# Patient Record
Sex: Female | Born: 1935
Health system: Southern US, Community
[De-identification: ages and names within clinical notes are randomized; demographics above are authoritative.]

## PROBLEM LIST (undated history)

## (undated) DIAGNOSIS — E785 Hyperlipidemia, unspecified: Secondary | ICD-10-CM

## (undated) DIAGNOSIS — I10 Essential (primary) hypertension: Secondary | ICD-10-CM

## (undated) DIAGNOSIS — E079 Disorder of thyroid, unspecified: Secondary | ICD-10-CM

---

## 2005-01-10 ENCOUNTER — Ambulatory Visit: Payer: Self-pay | Admitting: Internal Medicine

## 2006-02-28 ENCOUNTER — Ambulatory Visit: Payer: Self-pay | Admitting: Internal Medicine

## 2007-03-08 ENCOUNTER — Ambulatory Visit: Payer: Self-pay | Admitting: Internal Medicine

## 2008-12-08 ENCOUNTER — Ambulatory Visit: Payer: Self-pay | Admitting: Internal Medicine

## 2011-01-20 ENCOUNTER — Ambulatory Visit: Payer: Self-pay | Admitting: Internal Medicine

## 2011-07-15 ENCOUNTER — Ambulatory Visit: Payer: Self-pay | Admitting: Ophthalmology

## 2011-07-15 DIAGNOSIS — I10 Essential (primary) hypertension: Secondary | ICD-10-CM

## 2011-07-25 ENCOUNTER — Ambulatory Visit: Payer: Self-pay | Admitting: Ophthalmology

## 2012-12-19 ENCOUNTER — Ambulatory Visit: Payer: Self-pay | Admitting: Internal Medicine

## 2014-06-24 DIAGNOSIS — E538 Deficiency of other specified B group vitamins: Secondary | ICD-10-CM | POA: Diagnosis not present

## 2014-07-18 DIAGNOSIS — Z961 Presence of intraocular lens: Secondary | ICD-10-CM | POA: Diagnosis not present

## 2014-07-23 DIAGNOSIS — E538 Deficiency of other specified B group vitamins: Secondary | ICD-10-CM | POA: Diagnosis not present

## 2014-07-23 DIAGNOSIS — M170 Bilateral primary osteoarthritis of knee: Secondary | ICD-10-CM | POA: Diagnosis not present

## 2014-08-26 DIAGNOSIS — E538 Deficiency of other specified B group vitamins: Secondary | ICD-10-CM | POA: Diagnosis not present

## 2014-08-29 ENCOUNTER — Ambulatory Visit: Payer: Self-pay | Admitting: Podiatry

## 2014-09-25 DIAGNOSIS — E538 Deficiency of other specified B group vitamins: Secondary | ICD-10-CM | POA: Diagnosis not present

## 2014-10-03 ENCOUNTER — Emergency Department
Admit: 2014-10-03 | Disposition: A | Discharge status: Home / Self Care | Payer: Self-pay | Admitting: Emergency Medicine

## 2014-10-03 DIAGNOSIS — D1809 Hemangioma of other sites: Secondary | ICD-10-CM | POA: Diagnosis not present

## 2014-10-03 DIAGNOSIS — C499 Malignant neoplasm of connective and soft tissue, unspecified: Secondary | ICD-10-CM | POA: Diagnosis not present

## 2014-10-03 DIAGNOSIS — I10 Essential (primary) hypertension: Secondary | ICD-10-CM | POA: Diagnosis not present

## 2014-10-03 DIAGNOSIS — R109 Unspecified abdominal pain: Secondary | ICD-10-CM | POA: Diagnosis not present

## 2014-10-03 DIAGNOSIS — D1803 Hemangioma of intra-abdominal structures: Secondary | ICD-10-CM | POA: Diagnosis not present

## 2014-10-03 LAB — URINALYSIS, COMPLETE
Bilirubin,UR: NEGATIVE
Glucose,UR: NEGATIVE mg/dL (ref 0–75)
Ketone: NEGATIVE
Nitrite: NEGATIVE
Ph: 7 (ref 4.5–8.0)
Protein: NEGATIVE
Specific Gravity: 1.012 (ref 1.003–1.030)

## 2014-10-03 LAB — CBC WITH DIFFERENTIAL/PLATELET
Basophil #: 0 10*3/uL (ref 0.0–0.1)
Basophil %: 0.3 %
Eosinophil #: 0.1 10*3/uL (ref 0.0–0.7)
Eosinophil %: 0.4 %
HCT: 36.5 % (ref 35.0–47.0)
HGB: 12 g/dL (ref 12.0–16.0)
LYMPHS ABS: 0.5 10*3/uL — AB (ref 1.0–3.6)
Lymphocyte %: 4.1 %
MCH: 29.2 pg (ref 26.0–34.0)
MCHC: 32.9 g/dL (ref 32.0–36.0)
MCV: 89 fL (ref 80–100)
MONO ABS: 0.9 x10 3/mm (ref 0.2–0.9)
MONOS PCT: 7.7 %
NEUTROS ABS: 10.6 10*3/uL — AB (ref 1.4–6.5)
Neutrophil %: 87.5 %
Platelet: 280 10*3/uL (ref 150–440)
RBC: 4.11 10*6/uL (ref 3.80–5.20)
RDW: 13.2 % (ref 11.5–14.5)
WBC: 12.1 10*3/uL — ABNORMAL HIGH (ref 3.6–11.0)

## 2014-10-03 LAB — COMPREHENSIVE METABOLIC PANEL
ALBUMIN: 4 g/dL
ALK PHOS: 73 U/L
ALT: 15 U/L
ANION GAP: 9 (ref 7–16)
BUN: 15 mg/dL
Bilirubin,Total: 0.7 mg/dL
Calcium, Total: 8.9 mg/dL
Chloride: 102 mmol/L
Co2: 26 mmol/L
Creatinine: 0.81 mg/dL
EGFR (African American): >60
Glucose: 140 mg/dL — ABNORMAL HIGH
Potassium: 3.5 mmol/L
SGOT(AST): 29 U/L
Sodium: 137 mmol/L
TOTAL PROTEIN: 7.6 g/dL

## 2014-10-05 NOTE — Op Note (Signed)
PATIENT NAME:  Carolyn Fields, STAKES MR#:  [id number] DATE OF BIRTH:  1922/07/28  DATE OF PROCEDURE:  07/25/2011  PREOPERATIVE DIAGNOSIS:  Cataract, right eye.   POSTOPERATIVE DIAGNOSIS:  Cataract, right eye.  PROCEDURE PERFORMED:  Extracapsular cataract extraction using phacoemulsification with placement of an Alcon SN6CWS 23.5-diopter posterior chamber lens, serial # [id number].  SURGEON:  Loura Back. Avianna Moynahan, MD  ASSISTANT:  None.  ANESTHESIA:  4% lidocaine and 0.75% Marcaine in a 50/50 mixture with 10 units/ mL of Hylenex added, given as a peribulbar.  ANESTHESIOLOGIST:  Dr. Boston Service   COMPLICATIONS:  None.  ESTIMATED BLOOD LOSS:  Less than 1 mL.  DESCRIPTION OF PROCEDURE:  The patient was brought to the operating room and given a peribulbar block.  The patient was then prepped and draped in the usual fashion.  The vertical rectus muscles were imbricated using 5-0 silk sutures.  These sutures were then clamped to the sterile drapes as bridle sutures.  A limbal peritomy was performed extending two clock hours and hemostasis was obtained with cautery.  A partial thickness scleral groove was made at the surgical limbus and dissected anteriorly in a lamellar dissection using an Alcon crescent knife.  The anterior chamber was entered superonasally with a Superblade and through the lamellar dissection with a 2.6 mm keratome.  DisCoVisc was used to replace the aqueous and a continuous tear capsulorrhexis was carried out.  Hydrodissection and hydrodelineation were carried out with balanced salt and a 27 gauge canula.  The nucleus was rotated to confirm the effectiveness of the hydrodissection.  Phacoemulsification was carried out using a divide-and-conquer technique.  Total ultrasound time was 1 minute and 17 seconds with an average power of 19.3 percent.  Irrigation/aspiration was used to remove the residual cortex.  DisCoVisc was used to inflate the capsule and the internal incision was  enlarged to 3 mm with the crescent knife.  The intraocular lens was folded and inserted into the capsular bag using the AcrySert delivery system.  Irrigation/aspiration was used to remove the residual DisCoVisc.  Miostat was injected into the anterior chamber through the paracentesis track to inflate the anterior chamber and induce miosis.  The wound was checked for leaks and none were found. The conjunctiva was closed with cautery and the bridle sutures were removed.  Two drops of 0.3% Vigamox were placed on the eye.   An eye shield was placed on the eye.  The patient was discharged to the recovery room in good condition.  ____________________________ Loura Back Ladarius Seubert, MD sad:cms D: 07/25/2011 13:03:34 ET T: 07/25/2011 13:38:43 ET JOB#: [id number]  cc: Remo Lipps A. Courtany Mcmurphy, MD, <Dictator>  Martie Lee MD ELECTRONICALLY SIGNED 08/01/2011 13:31

## 2014-10-07 DIAGNOSIS — E538 Deficiency of other specified B group vitamins: Secondary | ICD-10-CM | POA: Diagnosis not present

## 2014-10-07 DIAGNOSIS — R1084 Generalized abdominal pain: Secondary | ICD-10-CM | POA: Diagnosis not present

## 2014-10-07 DIAGNOSIS — N8111 Cystocele, midline: Secondary | ICD-10-CM | POA: Diagnosis not present

## 2014-10-07 DIAGNOSIS — S50312A Abrasion of left elbow, initial encounter: Secondary | ICD-10-CM | POA: Diagnosis not present

## 2014-10-28 DIAGNOSIS — E538 Deficiency of other specified B group vitamins: Secondary | ICD-10-CM | POA: Diagnosis not present

## 2014-11-18 DIAGNOSIS — M170 Bilateral primary osteoarthritis of knee: Secondary | ICD-10-CM | POA: Diagnosis not present

## 2014-11-18 DIAGNOSIS — N8111 Cystocele, midline: Secondary | ICD-10-CM | POA: Diagnosis not present

## 2014-12-02 DIAGNOSIS — E538 Deficiency of other specified B group vitamins: Secondary | ICD-10-CM | POA: Diagnosis not present

## 2014-12-02 DIAGNOSIS — I10 Essential (primary) hypertension: Secondary | ICD-10-CM | POA: Diagnosis not present

## 2014-12-02 DIAGNOSIS — E039 Hypothyroidism, unspecified: Secondary | ICD-10-CM | POA: Diagnosis not present

## 2014-12-02 DIAGNOSIS — I272 Other secondary pulmonary hypertension: Secondary | ICD-10-CM | POA: Diagnosis not present

## 2015-01-01 DIAGNOSIS — E538 Deficiency of other specified B group vitamins: Secondary | ICD-10-CM | POA: Diagnosis not present

## 2015-01-06 DIAGNOSIS — Q6689 Other  specified congenital deformities of feet: Secondary | ICD-10-CM | POA: Diagnosis not present

## 2015-01-06 DIAGNOSIS — M7752 Other enthesopathy of left foot: Secondary | ICD-10-CM | POA: Diagnosis not present

## 2015-03-31 DIAGNOSIS — M170 Bilateral primary osteoarthritis of knee: Secondary | ICD-10-CM | POA: Diagnosis not present

## 2015-05-13 DIAGNOSIS — M7752 Other enthesopathy of left foot: Secondary | ICD-10-CM | POA: Diagnosis not present

## 2015-05-27 DIAGNOSIS — Z79899 Other long term (current) drug therapy: Secondary | ICD-10-CM | POA: Diagnosis not present

## 2015-05-27 DIAGNOSIS — I10 Essential (primary) hypertension: Secondary | ICD-10-CM | POA: Diagnosis not present

## 2015-05-27 DIAGNOSIS — E538 Deficiency of other specified B group vitamins: Secondary | ICD-10-CM | POA: Diagnosis not present

## 2015-05-27 DIAGNOSIS — Z23 Encounter for immunization: Secondary | ICD-10-CM | POA: Diagnosis not present

## 2015-05-27 DIAGNOSIS — R5383 Other fatigue: Secondary | ICD-10-CM | POA: Diagnosis not present

## 2015-05-27 DIAGNOSIS — E039 Hypothyroidism, unspecified: Secondary | ICD-10-CM | POA: Diagnosis not present

## 2015-05-27 DIAGNOSIS — E7800 Pure hypercholesterolemia, unspecified: Secondary | ICD-10-CM | POA: Diagnosis not present

## 2015-06-30 DIAGNOSIS — E538 Deficiency of other specified B group vitamins: Secondary | ICD-10-CM | POA: Diagnosis not present

## 2015-07-02 DIAGNOSIS — M170 Bilateral primary osteoarthritis of knee: Secondary | ICD-10-CM | POA: Diagnosis not present

## 2015-08-05 DIAGNOSIS — E538 Deficiency of other specified B group vitamins: Secondary | ICD-10-CM | POA: Diagnosis not present

## 2015-09-09 DIAGNOSIS — M170 Bilateral primary osteoarthritis of knee: Secondary | ICD-10-CM | POA: Diagnosis not present

## 2015-09-09 DIAGNOSIS — E538 Deficiency of other specified B group vitamins: Secondary | ICD-10-CM | POA: Diagnosis not present

## 2015-09-16 DIAGNOSIS — H26491 Other secondary cataract, right eye: Secondary | ICD-10-CM | POA: Diagnosis not present

## 2015-09-23 DIAGNOSIS — H6123 Impacted cerumen, bilateral: Secondary | ICD-10-CM | POA: Diagnosis not present

## 2015-09-23 DIAGNOSIS — H6063 Unspecified chronic otitis externa, bilateral: Secondary | ICD-10-CM | POA: Diagnosis not present

## 2015-10-06 DIAGNOSIS — M7752 Other enthesopathy of left foot: Secondary | ICD-10-CM | POA: Diagnosis not present

## 2015-10-06 DIAGNOSIS — Q6689 Other  specified congenital deformities of feet: Secondary | ICD-10-CM | POA: Diagnosis not present

## 2015-11-05 DIAGNOSIS — E538 Deficiency of other specified B group vitamins: Secondary | ICD-10-CM | POA: Diagnosis not present

## 2015-12-08 DIAGNOSIS — Z79899 Other long term (current) drug therapy: Secondary | ICD-10-CM | POA: Diagnosis not present

## 2015-12-08 DIAGNOSIS — E7800 Pure hypercholesterolemia, unspecified: Secondary | ICD-10-CM | POA: Diagnosis not present

## 2015-12-08 DIAGNOSIS — I10 Essential (primary) hypertension: Secondary | ICD-10-CM | POA: Diagnosis not present

## 2015-12-08 DIAGNOSIS — R600 Localized edema: Secondary | ICD-10-CM | POA: Diagnosis not present

## 2015-12-08 DIAGNOSIS — E538 Deficiency of other specified B group vitamins: Secondary | ICD-10-CM | POA: Diagnosis not present

## 2015-12-08 DIAGNOSIS — E039 Hypothyroidism, unspecified: Secondary | ICD-10-CM | POA: Diagnosis not present

## 2015-12-08 DIAGNOSIS — D649 Anemia, unspecified: Secondary | ICD-10-CM | POA: Diagnosis not present

## 2015-12-28 DIAGNOSIS — M170 Bilateral primary osteoarthritis of knee: Secondary | ICD-10-CM | POA: Diagnosis not present

## 2015-12-28 DIAGNOSIS — M1711 Unilateral primary osteoarthritis, right knee: Secondary | ICD-10-CM | POA: Diagnosis not present

## 2015-12-28 DIAGNOSIS — M7989 Other specified soft tissue disorders: Secondary | ICD-10-CM | POA: Diagnosis not present

## 2015-12-28 DIAGNOSIS — M1712 Unilateral primary osteoarthritis, left knee: Secondary | ICD-10-CM | POA: Diagnosis not present

## 2016-02-16 DIAGNOSIS — E538 Deficiency of other specified B group vitamins: Secondary | ICD-10-CM | POA: Diagnosis not present

## 2016-02-26 DIAGNOSIS — H01003 Unspecified blepharitis right eye, unspecified eyelid: Secondary | ICD-10-CM | POA: Diagnosis not present

## 2016-03-04 DIAGNOSIS — H01003 Unspecified blepharitis right eye, unspecified eyelid: Secondary | ICD-10-CM | POA: Diagnosis not present

## 2016-03-15 DIAGNOSIS — H26493 Other secondary cataract, bilateral: Secondary | ICD-10-CM | POA: Diagnosis not present

## 2016-03-29 DIAGNOSIS — H01003 Unspecified blepharitis right eye, unspecified eyelid: Secondary | ICD-10-CM | POA: Diagnosis not present

## 2016-03-30 DIAGNOSIS — M1712 Unilateral primary osteoarthritis, left knee: Secondary | ICD-10-CM | POA: Diagnosis not present

## 2016-03-30 DIAGNOSIS — M1711 Unilateral primary osteoarthritis, right knee: Secondary | ICD-10-CM | POA: Diagnosis not present

## 2016-03-30 DIAGNOSIS — E538 Deficiency of other specified B group vitamins: Secondary | ICD-10-CM | POA: Diagnosis not present

## 2016-04-12 DIAGNOSIS — M1711 Unilateral primary osteoarthritis, right knee: Secondary | ICD-10-CM | POA: Diagnosis not present

## 2016-04-29 DIAGNOSIS — E538 Deficiency of other specified B group vitamins: Secondary | ICD-10-CM | POA: Diagnosis not present

## 2016-05-10 DIAGNOSIS — M7752 Other enthesopathy of left foot: Secondary | ICD-10-CM | POA: Diagnosis not present

## 2016-05-10 DIAGNOSIS — Q6689 Other  specified congenital deformities of feet: Secondary | ICD-10-CM | POA: Diagnosis not present

## 2016-06-02 DIAGNOSIS — E538 Deficiency of other specified B group vitamins: Secondary | ICD-10-CM | POA: Diagnosis not present

## 2016-06-02 DIAGNOSIS — M1712 Unilateral primary osteoarthritis, left knee: Secondary | ICD-10-CM | POA: Diagnosis not present

## 2016-06-02 DIAGNOSIS — M1711 Unilateral primary osteoarthritis, right knee: Secondary | ICD-10-CM | POA: Diagnosis not present

## 2016-06-14 DIAGNOSIS — E039 Hypothyroidism, unspecified: Secondary | ICD-10-CM | POA: Diagnosis not present

## 2016-06-14 DIAGNOSIS — Z79899 Other long term (current) drug therapy: Secondary | ICD-10-CM | POA: Diagnosis not present

## 2016-06-14 DIAGNOSIS — E538 Deficiency of other specified B group vitamins: Secondary | ICD-10-CM | POA: Diagnosis not present

## 2016-06-14 DIAGNOSIS — I10 Essential (primary) hypertension: Secondary | ICD-10-CM | POA: Diagnosis not present

## 2016-06-14 DIAGNOSIS — N8111 Cystocele, midline: Secondary | ICD-10-CM | POA: Diagnosis not present

## 2016-06-14 DIAGNOSIS — M1712 Unilateral primary osteoarthritis, left knee: Secondary | ICD-10-CM | POA: Diagnosis not present

## 2016-06-14 DIAGNOSIS — E7800 Pure hypercholesterolemia, unspecified: Secondary | ICD-10-CM | POA: Diagnosis not present

## 2016-08-02 DIAGNOSIS — M170 Bilateral primary osteoarthritis of knee: Secondary | ICD-10-CM | POA: Diagnosis not present

## 2016-08-02 DIAGNOSIS — R05 Cough: Secondary | ICD-10-CM | POA: Diagnosis not present

## 2016-08-02 DIAGNOSIS — E538 Deficiency of other specified B group vitamins: Secondary | ICD-10-CM | POA: Diagnosis not present

## 2016-08-24 DIAGNOSIS — M1712 Unilateral primary osteoarthritis, left knee: Secondary | ICD-10-CM | POA: Diagnosis not present

## 2016-09-06 DIAGNOSIS — E538 Deficiency of other specified B group vitamins: Secondary | ICD-10-CM | POA: Diagnosis not present

## 2016-09-27 DIAGNOSIS — H10501 Unspecified blepharoconjunctivitis, right eye: Secondary | ICD-10-CM | POA: Diagnosis not present

## 2016-10-11 ENCOUNTER — Ambulatory Visit: Payer: Self-pay | Admitting: Urology

## 2016-10-11 DIAGNOSIS — E538 Deficiency of other specified B group vitamins: Secondary | ICD-10-CM | POA: Diagnosis not present

## 2016-10-17 NOTE — Progress Notes (Deleted)
10/18/2016 1:23 PM   Carolyn Fields 08/09/1922 [id number]  Referring provider: Ezequiel Kayser, MD [facility] [place], [place] [address]  No chief complaint on file.   HPI: Patient is a *** -year-old *** female who is referred to [place] by, ***, for urinary incontinence.  Patient states that she has had urinary incontinence for ***.  Patient has incontinence with ***.   She is experiencing *** incontinent episodes during the day. She is experiencing *** incontinent episodes during the night.  Her incontinence volume is ***.   She is wearing *** pads/depends daily.    She is having associated urinary frequency, urgency, dysuria, nocturia, intermittency, hesitancy, straining to urinate and weak urinary stream.   ***  She is not having associated urinary frequency, urgency, dysuria, nocturia, intermittency, hesitancy, straining to urinate and weak urinary stream.   ***  She does/does not have a history of urinary tract infections, STI's or injury to the bladder. ***  She denies/endorses dysuria, gross hematuria, suprapubic pain, back pain, abdominal pain or flank pain.***  She denies/endorses dysuria, gross hematuria, suprapubic pain, back pain, abdominal pain or flank pain.***  She has not had any recent fevers, chills, nausea or vomiting. ***  She has not had any recent fevers, chills, nausea or vomiting. ***  She does/does not have a history of nephrolithiasis, GU surgery or GU trauma. ***  She does/does not have a history of nephrolithiasis, GU surgery or GU trauma. ***  She is/is not sexually active.  She has/has not noted incontinence with sexual intercourse.  ***   She is post menopausal. ***  She admits to/denies constipation and/or diarrhea. ***  She is having/ not having pain with bladder filling.  ***  She has/not had any recent imaging studies.  ***  She is drinking *** of water daily.   She is drinking *** caffeinated beverages  daily.  She is drinking *** alcoholic beverages daily.    Her risk factors for incontinence are obesity, para T, vaginal delivery, a family history of incontinence, age, smoking, caffeine, diabetes, stroke, depression, fecal incontinence, vaginal atrophy, oral estrogens, pelvic surgery, pelvic radiation and/or neurological disorders. ***  She is taking antihistamines, decongestants, benzo's, opioids, OAB medication, ACE inhibitors, alpha-agonists, alpha blockers, antiarrhythmics, diuretics, antidepressants, antipsychotics, skeletal muscle relaxants and/or oral estrogen.      PMH: No past medical history on file.  Surgical History: No past surgical history on file.  Home Medications:  Allergies as of 10/18/2016   Not on File     Medication List    as of 10/17/2016  1:23 PM   You have not been prescribed any medications.     Allergies: Allergies not on file  Family History: No family history on file.  Social History:  has no tobacco, alcohol, and drug history on file.  ROS:                                        Physical Exam: There were no vitals taken for this visit.  Constitutional: Well nourished. Alert and oriented, No acute distress. HEENT:  AT, moist mucus membranes. Trachea midline, no masses. Cardiovascular: No clubbing, cyanosis, or edema. Respiratory: Normal respiratory effort, no increased work of breathing. GI: Abdomen is soft, non tender, non distended, no abdominal masses. Liver and spleen not palpable.  No hernias appreciated.  Stool sample for occult testing  is not indicated.   GU: No CVA tenderness.  No bladder fullness or masses.  Normal external genitalia, normal pubic hair distribution, no lesions.  Normal urethral meatus, no lesions, no prolapse, no discharge.   No urethral masses, tenderness and/or tenderness. No bladder fullness, tenderness or masses. Normal vagina mucosa, good estrogen effect, no discharge, no lesions, good  pelvic support, no cystocele or rectocele noted.  No cervical motion tenderness.  Uterus is freely mobile and non-fixed.  No adnexal/parametria masses or tenderness noted.  Anus and perineum are without rashes or lesions.   *** Skin: No rashes, bruises or suspicious lesions. Lymph: No cervical or inguinal adenopathy. Neurologic: Grossly intact, no focal deficits, moving all 4 extremities. Psychiatric: Normal mood and affect.  Laboratory Data: Lab Results  Component Value Date   WBC 12.1 (H) 10/03/2014   HGB 12.0 10/03/2014   HCT 36.5 10/03/2014   MCV 89 10/03/2014   PLT 280 10/03/2014    Lab Results  Component Value Date   CREATININE 0.81 10/03/2014    Lab Results  Component Value Date   AST 29 10/03/2014   Lab Results  Component Value Date   ALT 15 10/03/2014      Urinalysis    Component Value Date/Time   COLORURINE Yellow 10/03/2014 1500   APPEARANCEUR Cloudy 10/03/2014 1500   LABSPEC 1.012 10/03/2014 1500   PHURINE 7.0 10/03/2014 1500   GLUCOSEU Negative 10/03/2014 1500   HGBUR 2+ 10/03/2014 1500   BILIRUBINUR Negative 10/03/2014 1500   KETONESUR Negative 10/03/2014 1500   PROTEINUR Negative 10/03/2014 1500   NITRITE Negative 10/03/2014 1500   LEUKOCYTESUR 1+ 10/03/2014 1500    Pertinent Imaging: ***  Assessment & Plan:  ***  1. Nocturia  - I explained to the patient that nocturia is often multi-factorial and difficult to treat.  Sleeping disorders, heart conditions, peripheral vascular disease, diabetes, an enlarged prostate for men, an urethral stricture causing bladder outlet obstruction and/or certain medications can contribute to nocturia.  - I have suggested that the patient avoid caffeine after noon and alcohol in the evening.  He or she may also benefit from fluid restrictions after 6:00 in the evening and voiding just prior to bedtime.  - I have explained that research studies have showed that over 84% of patients with sleep apnea reported frequent  nighttime urination.   With sleep apnea, oxygen decreases, carbon dioxide increases, the blood become more acidic, the heart rate drops and blood vessels in the lung constrict.  The body is then alerted that something is very wrong. The sleeper must wake enough to reopen the airway. By this time, the heart is racing and experiences a false signal of fluid overload. The heart excretes a hormone-like protein that tells the body to get rid of sodium and water, resulting in nocturia.  -  I also informed the patient that a recent study noted that decreasing sodium intake to 2.3 grams daily, if they don't have issues with hyponatremia, can also reduce the number of nightly voids  - There is also an increased incidence in sleep apnea with menopause, symptoms include night sweats, daytime sleepiness, depressed mood, and cognitive complaints like poor concentration or problems with short-term memory ***  - The patient may benefit from a discussion with his or her primary care physician to see if he or she has risk factors for sleep apnea or other sleep disturbances and obtaining a sleep study.   No Follow-up on file.  These notes generated with voice  recognition software. I apologize for typographical errors.  Zara Council, [facility] [address] [place], [place] [address] [phone]

## 2016-10-18 ENCOUNTER — Ambulatory Visit: Payer: Self-pay | Admitting: Urology

## 2016-10-18 DIAGNOSIS — M1711 Unilateral primary osteoarthritis, right knee: Secondary | ICD-10-CM | POA: Diagnosis not present

## 2016-10-18 DIAGNOSIS — M170 Bilateral primary osteoarthritis of knee: Secondary | ICD-10-CM | POA: Diagnosis not present

## 2016-11-01 DIAGNOSIS — D2372 Other benign neoplasm of skin of left lower limb, including hip: Secondary | ICD-10-CM | POA: Diagnosis not present

## 2016-11-01 DIAGNOSIS — M79672 Pain in left foot: Secondary | ICD-10-CM | POA: Diagnosis not present

## 2016-11-10 DIAGNOSIS — H01001 Unspecified blepharitis right upper eyelid: Secondary | ICD-10-CM | POA: Diagnosis not present

## 2016-11-14 DIAGNOSIS — E039 Hypothyroidism, unspecified: Secondary | ICD-10-CM | POA: Diagnosis not present

## 2016-11-14 DIAGNOSIS — I10 Essential (primary) hypertension: Secondary | ICD-10-CM | POA: Diagnosis not present

## 2016-11-14 DIAGNOSIS — E538 Deficiency of other specified B group vitamins: Secondary | ICD-10-CM | POA: Diagnosis not present

## 2016-11-14 DIAGNOSIS — Z0000 Encounter for general adult medical examination without abnormal findings: Secondary | ICD-10-CM | POA: Diagnosis not present

## 2016-11-14 DIAGNOSIS — N8111 Cystocele, midline: Secondary | ICD-10-CM | POA: Diagnosis not present

## 2016-11-14 DIAGNOSIS — E7800 Pure hypercholesterolemia, unspecified: Secondary | ICD-10-CM | POA: Diagnosis not present

## 2016-11-14 DIAGNOSIS — M170 Bilateral primary osteoarthritis of knee: Secondary | ICD-10-CM | POA: Diagnosis not present

## 2016-11-14 DIAGNOSIS — Z79899 Other long term (current) drug therapy: Secondary | ICD-10-CM | POA: Diagnosis not present

## 2016-11-14 DIAGNOSIS — Z7189 Other specified counseling: Secondary | ICD-10-CM | POA: Diagnosis not present

## 2016-11-29 DIAGNOSIS — M1712 Unilateral primary osteoarthritis, left knee: Secondary | ICD-10-CM | POA: Diagnosis not present

## 2016-11-29 DIAGNOSIS — M1711 Unilateral primary osteoarthritis, right knee: Secondary | ICD-10-CM | POA: Diagnosis not present

## 2016-12-08 DIAGNOSIS — E538 Deficiency of other specified B group vitamins: Secondary | ICD-10-CM | POA: Diagnosis not present

## 2016-12-28 DIAGNOSIS — M1711 Unilateral primary osteoarthritis, right knee: Secondary | ICD-10-CM | POA: Diagnosis not present

## 2016-12-29 DIAGNOSIS — N812 Incomplete uterovaginal prolapse: Secondary | ICD-10-CM | POA: Diagnosis not present

## 2017-02-10 DIAGNOSIS — E538 Deficiency of other specified B group vitamins: Secondary | ICD-10-CM | POA: Diagnosis not present

## 2017-03-07 DIAGNOSIS — M1712 Unilateral primary osteoarthritis, left knee: Secondary | ICD-10-CM | POA: Diagnosis not present

## 2017-03-07 DIAGNOSIS — M1711 Unilateral primary osteoarthritis, right knee: Secondary | ICD-10-CM | POA: Diagnosis not present

## 2017-03-21 DIAGNOSIS — Z23 Encounter for immunization: Secondary | ICD-10-CM | POA: Diagnosis not present

## 2017-03-21 DIAGNOSIS — E538 Deficiency of other specified B group vitamins: Secondary | ICD-10-CM | POA: Diagnosis not present

## 2017-04-06 DIAGNOSIS — H01003 Unspecified blepharitis right eye, unspecified eyelid: Secondary | ICD-10-CM | POA: Diagnosis not present

## 2017-04-13 DIAGNOSIS — R69 Illness, unspecified: Secondary | ICD-10-CM | POA: Diagnosis not present

## 2017-04-18 DIAGNOSIS — D2372 Other benign neoplasm of skin of left lower limb, including hip: Secondary | ICD-10-CM | POA: Diagnosis not present

## 2017-04-18 DIAGNOSIS — M79672 Pain in left foot: Secondary | ICD-10-CM | POA: Diagnosis not present

## 2017-08-09 ENCOUNTER — Emergency Department
Admission: EM | Admit: 2017-08-09 | Discharge: 2017-08-09 | Disposition: A | Discharge status: Home / Self Care | Payer: Medicare HMO | Attending: Emergency Medicine | Admitting: Emergency Medicine

## 2017-08-09 ENCOUNTER — Encounter: Payer: Self-pay | Admitting: Emergency Medicine

## 2017-08-09 ENCOUNTER — Emergency Department: Payer: Medicare HMO

## 2017-08-09 DIAGNOSIS — W19XXXA Unspecified fall, initial encounter: Secondary | ICD-10-CM

## 2017-08-09 DIAGNOSIS — E039 Hypothyroidism, unspecified: Secondary | ICD-10-CM | POA: Diagnosis not present

## 2017-08-09 DIAGNOSIS — R531 Weakness: Secondary | ICD-10-CM | POA: Diagnosis present

## 2017-08-09 DIAGNOSIS — I10 Essential (primary) hypertension: Secondary | ICD-10-CM | POA: Diagnosis not present

## 2017-08-09 DIAGNOSIS — Z79899 Other long term (current) drug therapy: Secondary | ICD-10-CM | POA: Insufficient documentation

## 2017-08-09 HISTORY — DX: Essential (primary) hypertension: I10

## 2017-08-09 HISTORY — DX: Disorder of thyroid, unspecified: E07.9

## 2017-08-09 HISTORY — DX: Hyperlipidemia, unspecified: E78.5

## 2017-08-09 LAB — URINALYSIS, COMPLETE (UACMP) WITH MICROSCOPIC
BACTERIA UA: NONE SEEN
Bilirubin Urine: NEGATIVE
Glucose, UA: NEGATIVE mg/dL
Ketones, ur: NEGATIVE mg/dL
Leukocytes, UA: NEGATIVE
Nitrite: NEGATIVE
PROTEIN: NEGATIVE mg/dL
Specific Gravity, Urine: 1.015 (ref 1.005–1.030)
pH: 5 (ref 5.0–8.0)

## 2017-08-09 LAB — BASIC METABOLIC PANEL
ANION GAP: 9 (ref 5–15)
BUN: 18 mg/dL (ref 6–20)
CHLORIDE: 105 mmol/L (ref 101–111)
CO2: 25 mmol/L (ref 22–32)
Calcium: 8.7 mg/dL — ABNORMAL LOW (ref 8.9–10.3)
Creatinine, Ser: 0.91 mg/dL (ref 0.44–1.00)
GFR calc Af Amer: >60 mL/min (ref >60)
GFR calc non Af Amer: 53 mL/min — ABNORMAL LOW (ref >60)
GLUCOSE: 104 mg/dL — AB (ref 65–99)
Potassium: 3.3 mmol/L — ABNORMAL LOW (ref 3.5–5.1)
Sodium: 139 mmol/L (ref 135–145)

## 2017-08-09 LAB — CBC
HCT: 34.4 % — ABNORMAL LOW (ref 35.0–47.0)
HEMOGLOBIN: 11.3 g/dL — AB (ref 12.0–16.0)
MCH: 29 pg (ref 26.0–34.0)
MCHC: 32.8 g/dL (ref 32.0–36.0)
MCV: 88.3 fL (ref 80.0–100.0)
Platelets: 268 10*3/uL (ref 150–440)
RBC: 3.89 MIL/uL (ref 3.80–5.20)
RDW: 13.1 % (ref 11.5–14.5)
WBC: 7.9 10*3/uL (ref 3.6–11.0)

## 2017-08-09 LAB — TROPONIN I
TROPONIN I: 0.03 ng/mL — AB (ref <0.03)
Troponin I: 0.03 ng/mL (ref <0.03)

## 2017-08-09 NOTE — Discharge Instructions (Signed)
Please seek medical attention for any high fevers, chest pain, shortness of breath, change in behavior, persistent vomiting, bloody stool or any other new or concerning symptoms.  

## 2017-08-09 NOTE — ED Notes (Signed)
Informed RN that patient has been roomed and is ready for evaluation.  Patient in NAD at this time and call bell placed within reach.   

## 2017-08-09 NOTE — ED Triage Notes (Signed)
Pt comes into the ED via POV c/o fall, dizziness, and shortness of breath.  Patient states she fell on Saturday and she doesn't remember hitting her head but she has bruising noted on the right side of her forehead.  Patient denies any blood thinner use.  Patient states the dizziness and shortness of breath that has begun when she goes walking at the center she goes to.  Patient in NAD at this time with even and unlabored respirations and is neurologically intact at her baseline.

## 2017-08-09 NOTE — ED Notes (Signed)
Pt ambulatory to toilet without difficulties

## 2017-08-09 NOTE — ED Provider Notes (Signed)
[facility] Emergency Department Provider Note  ____________________________________________   I have reviewed the triage vital signs and the nursing notes.   HISTORY  Chief Complaint Dizziness; Shortness of Breath; and Fall   History limited by: Not Limited   HPI Carolyn Fields is a 84 y.o. female who presents to the emergency department today because of concern for weakness. Difficulty with walking and fall. The symptoms started 4 days ago.  The patient states that they have now been present for the past 4 days.  It is unclear why she chose to come in today.  It does not sound like any new symptoms have presented themselves.  Family seems to indicate that she has been having some difficulty with walking for a little bit longer.  Patient denies any fevers.  Patient denies any concurrent chest pain or palpitations.   Per medical record review patient has a history of HLD, HTN.  Past Medical History:  Diagnosis Date  . Hyperlipidemia   . Hypertension   . Thyroid disease    hypothyroidism    There are no active problems to display for this patient.   History reviewed. No pertinent surgical history.  Prior to Admission medications   Medication Sig Start Date End Date Taking? Authorizing Provider  levothyroxine (SYNTHROID, LEVOTHROID) 88 MCG tablet Take 1 tablet by mouth daily. 06/20/17   [provider]  lovastatin (MEVACOR) 20 MG tablet Take 1 tablet by mouth daily. 05/18/17   [provider]  triamterene-hydrochlorothiazide (MAXZIDE-25) 37.5-25 MG tablet TAKE 0.5 TABLETS BY MOUTH EVERY MORNING FOR BLOOD PRESSURE 05/24/17   [provider]    Allergies Patient has no known allergies.  No family history on file.  Social History Social History   Tobacco Use  . Smoking status: Never Smoker  . Smokeless tobacco: Never Used  Substance Use Topics  . Alcohol use: No    Frequency: Never  . Drug use: No    Review of  Systems Constitutional: No fever/chills Eyes: No visual changes. ENT: No sore throat. Cardiovascular: Denies chest pain. Respiratory: Positive shortness of breath. Gastrointestinal: No abdominal pain.  Positive for diarrhea.  Genitourinary: Negative for dysuria. Musculoskeletal: Negative for back pain. Skin: Bruise to right forehead Neurological: Negative for headaches, focal weakness or numbness.  ____________________________________________   PHYSICAL EXAM:  VITAL SIGNS: ED Triage Vitals  Enc Vitals Group     BP 08/09/17 1437 124/60     Pulse Rate 08/09/17 1437 84     Resp 08/09/17 1437 18     Temp 08/09/17 1437 98.2 F (36.8 C)     Temp Source 08/09/17 1437 Oral     SpO2 08/09/17 1437 96 %     Weight 08/09/17 1438 160 lb (72.6 kg)     Height 08/09/17 1438 5\' 8"  (1.727 m)    Constitutional: Alert and oriented. Well appearing and in no distress. Eyes: Conjunctivae are normal.  ENT   Head: Normocephalic. Bruise to right forehead.   Nose: No congestion/rhinnorhea.   Mouth/Throat: Mucous membranes are moist.   Neck: No stridor. Hematological/Lymphatic/Immunilogical: No cervical lymphadenopathy. Cardiovascular: Normal rate, regular rhythm.  No murmurs, rubs, or gallops.  Respiratory: Normal respiratory effort without tachypnea nor retractions. Breath sounds are clear and equal bilaterally. No wheezes/rales/rhonchi. Gastrointestinal: Soft and non tender. No rebound. No guarding.  Genitourinary: Deferred Musculoskeletal: Normal range of motion in all extremities. No lower extremity edema. Neurologic:  Normal speech and language. No gross focal neurologic deficits are appreciated.  Skin:  Skin is warm, dry and intact. No rash noted. Psychiatric: Mood and affect are normal. Speech and behavior are normal. Patient exhibits appropriate insight and judgment.  ____________________________________________    LABS (pertinent positives/negatives)  Trop 0.03 CBC wbc  7.9, hgb 11.3, plt 268 BMP na 139, k 3.3, glu 104, cr 0.91  ____________________________________________   EKG  I, Nance Pear, attending physician, personally viewed and interpreted this EKG  EKG Time: 1441 Rate: 87 Rhythm: sinus rhythm  Axis: normal Intervals: qtc 454 QRS: narrow ST changes: no st elevation Impression: normal ekg   ____________________________________________    RADIOLOGY  CT head No acute findings  ____________________________________________   PROCEDURES  Procedures  ____________________________________________   INITIAL IMPRESSION / ASSESSMENT AND PLAN / ED COURSE  Pertinent labs & imaging results that were available during my care of the patient were reviewed by me and considered in my medical decision making (see chart for details).  Patient presented to the emergency department today because of concerns for difficulty with walking and a fall.    No evidence of any infection, significant anemia or intracranial process in the workup.  Patient did have a troponin of 0.03.  This was repeated and stayed consistent at 0.03.  At this point I think would be unlikely that the patient's fall a couple of days ago was due to cardiac cause given lack of significant elevation of troponin.  Additionally patient did not have any chest pain.  Given that otherwise patient's workup here was benign will plan on discharging home.  Discussed importance of follow-up with primary care.   ____________________________________________   FINAL CLINICAL IMPRESSION(S) / ED DIAGNOSES  Final diagnoses:  Weakness  Fall, initial encounter     Note: This dictation was prepared with Diplomatic Services operational officer dictation. Any transcriptional errors that result from this process are unintentional     Nance Pear, MD 08/09/17 [phone]

## 2017-08-11 LAB — URINE CULTURE

## 2018-07-19 ENCOUNTER — Other Ambulatory Visit: Payer: Self-pay | Admitting: Unknown Physician Specialty

## 2018-07-19 DIAGNOSIS — M1712 Unilateral primary osteoarthritis, left knee: Secondary | ICD-10-CM

## 2018-07-30 ENCOUNTER — Ambulatory Visit
Admission: RE | Admit: 2018-07-30 | Discharge: 2018-07-30 | Disposition: A | Discharge status: Home / Self Care | Payer: Medicare HMO | Source: Ambulatory Visit | Attending: Unknown Physician Specialty | Admitting: Unknown Physician Specialty

## 2018-07-30 DIAGNOSIS — M1712 Unilateral primary osteoarthritis, left knee: Secondary | ICD-10-CM | POA: Insufficient documentation

## 2018-11-19 DIAGNOSIS — E039 Hypothyroidism, unspecified: Secondary | ICD-10-CM | POA: Diagnosis not present

## 2018-11-19 DIAGNOSIS — E538 Deficiency of other specified B group vitamins: Secondary | ICD-10-CM | POA: Diagnosis not present

## 2018-11-19 DIAGNOSIS — N182 Chronic kidney disease, stage 2 (mild): Secondary | ICD-10-CM | POA: Diagnosis not present

## 2018-11-19 DIAGNOSIS — I272 Pulmonary hypertension, unspecified: Secondary | ICD-10-CM | POA: Diagnosis not present

## 2018-11-19 DIAGNOSIS — I10 Essential (primary) hypertension: Secondary | ICD-10-CM | POA: Diagnosis not present

## 2018-11-19 DIAGNOSIS — M170 Bilateral primary osteoarthritis of knee: Secondary | ICD-10-CM | POA: Diagnosis not present

## 2018-11-19 DIAGNOSIS — E7800 Pure hypercholesterolemia, unspecified: Secondary | ICD-10-CM | POA: Diagnosis not present

## 2018-11-19 DIAGNOSIS — Z79899 Other long term (current) drug therapy: Secondary | ICD-10-CM | POA: Diagnosis not present

## 2018-11-19 DIAGNOSIS — Z0000 Encounter for general adult medical examination without abnormal findings: Secondary | ICD-10-CM | POA: Diagnosis not present

## 2018-12-21 DIAGNOSIS — M170 Bilateral primary osteoarthritis of knee: Secondary | ICD-10-CM | POA: Diagnosis not present

## 2019-05-20 DIAGNOSIS — I272 Pulmonary hypertension, unspecified: Secondary | ICD-10-CM | POA: Diagnosis not present

## 2019-05-20 DIAGNOSIS — E538 Deficiency of other specified B group vitamins: Secondary | ICD-10-CM | POA: Diagnosis not present

## 2019-05-20 DIAGNOSIS — E7800 Pure hypercholesterolemia, unspecified: Secondary | ICD-10-CM | POA: Diagnosis not present

## 2019-05-20 DIAGNOSIS — N182 Chronic kidney disease, stage 2 (mild): Secondary | ICD-10-CM | POA: Diagnosis not present

## 2019-05-20 DIAGNOSIS — I129 Hypertensive chronic kidney disease with stage 1 through stage 4 chronic kidney disease, or unspecified chronic kidney disease: Secondary | ICD-10-CM | POA: Diagnosis not present

## 2019-05-20 DIAGNOSIS — M170 Bilateral primary osteoarthritis of knee: Secondary | ICD-10-CM | POA: Diagnosis not present

## 2019-05-20 DIAGNOSIS — N189 Chronic kidney disease, unspecified: Secondary | ICD-10-CM | POA: Diagnosis not present

## 2019-05-20 DIAGNOSIS — Z79899 Other long term (current) drug therapy: Secondary | ICD-10-CM | POA: Diagnosis not present

## 2019-05-20 DIAGNOSIS — E039 Hypothyroidism, unspecified: Secondary | ICD-10-CM | POA: Diagnosis not present

## 2019-05-31 DIAGNOSIS — M1712 Unilateral primary osteoarthritis, left knee: Secondary | ICD-10-CM | POA: Diagnosis not present

## 2019-05-31 DIAGNOSIS — M1711 Unilateral primary osteoarthritis, right knee: Secondary | ICD-10-CM | POA: Diagnosis not present

## 2019-05-31 DIAGNOSIS — M170 Bilateral primary osteoarthritis of knee: Secondary | ICD-10-CM | POA: Diagnosis not present

## 2019-07-19 DIAGNOSIS — F419 Anxiety disorder, unspecified: Secondary | ICD-10-CM | POA: Diagnosis not present

## 2019-07-19 DIAGNOSIS — M170 Bilateral primary osteoarthritis of knee: Secondary | ICD-10-CM | POA: Diagnosis not present

## 2019-07-19 DIAGNOSIS — I272 Pulmonary hypertension, unspecified: Secondary | ICD-10-CM | POA: Diagnosis not present

## 2019-07-19 DIAGNOSIS — E039 Hypothyroidism, unspecified: Secondary | ICD-10-CM | POA: Diagnosis not present

## 2019-07-19 DIAGNOSIS — R269 Unspecified abnormalities of gait and mobility: Secondary | ICD-10-CM | POA: Diagnosis not present

## 2019-07-19 DIAGNOSIS — I10 Essential (primary) hypertension: Secondary | ICD-10-CM | POA: Diagnosis not present

## 2019-07-19 DIAGNOSIS — G3184 Mild cognitive impairment, so stated: Secondary | ICD-10-CM | POA: Diagnosis not present

## 2019-07-19 DIAGNOSIS — E7800 Pure hypercholesterolemia, unspecified: Secondary | ICD-10-CM | POA: Diagnosis not present

## 2019-07-19 DIAGNOSIS — E538 Deficiency of other specified B group vitamins: Secondary | ICD-10-CM | POA: Diagnosis not present

## 2019-08-14 DIAGNOSIS — M170 Bilateral primary osteoarthritis of knee: Secondary | ICD-10-CM | POA: Diagnosis not present

## 2019-08-19 DIAGNOSIS — G4700 Insomnia, unspecified: Secondary | ICD-10-CM | POA: Diagnosis not present

## 2019-08-19 DIAGNOSIS — M4050 Lordosis, unspecified, site unspecified: Secondary | ICD-10-CM | POA: Diagnosis not present

## 2019-08-19 DIAGNOSIS — G3184 Mild cognitive impairment, so stated: Secondary | ICD-10-CM | POA: Diagnosis not present

## 2019-08-19 DIAGNOSIS — R300 Dysuria: Secondary | ICD-10-CM | POA: Diagnosis not present

## 2019-08-19 DIAGNOSIS — M40294 Other kyphosis, thoracic region: Secondary | ICD-10-CM | POA: Diagnosis not present

## 2019-08-19 DIAGNOSIS — R269 Unspecified abnormalities of gait and mobility: Secondary | ICD-10-CM | POA: Diagnosis not present

## 2019-08-19 DIAGNOSIS — I10 Essential (primary) hypertension: Secondary | ICD-10-CM | POA: Diagnosis not present

## 2019-08-19 DIAGNOSIS — M170 Bilateral primary osteoarthritis of knee: Secondary | ICD-10-CM | POA: Diagnosis not present

## 2019-08-19 DIAGNOSIS — E538 Deficiency of other specified B group vitamins: Secondary | ICD-10-CM | POA: Diagnosis not present

## 2019-08-21 DIAGNOSIS — M170 Bilateral primary osteoarthritis of knee: Secondary | ICD-10-CM | POA: Diagnosis not present

## 2019-08-22 DIAGNOSIS — R413 Other amnesia: Secondary | ICD-10-CM | POA: Diagnosis not present

## 2019-08-22 DIAGNOSIS — I959 Hypotension, unspecified: Secondary | ICD-10-CM | POA: Diagnosis not present

## 2019-08-28 DIAGNOSIS — M1712 Unilateral primary osteoarthritis, left knee: Secondary | ICD-10-CM | POA: Diagnosis not present

## 2019-08-28 DIAGNOSIS — M1711 Unilateral primary osteoarthritis, right knee: Secondary | ICD-10-CM | POA: Diagnosis not present

## 2019-08-28 DIAGNOSIS — M170 Bilateral primary osteoarthritis of knee: Secondary | ICD-10-CM | POA: Diagnosis not present

## 2019-08-30 DIAGNOSIS — E7800 Pure hypercholesterolemia, unspecified: Secondary | ICD-10-CM | POA: Diagnosis not present

## 2019-08-30 DIAGNOSIS — I38 Endocarditis, valve unspecified: Secondary | ICD-10-CM | POA: Diagnosis not present

## 2019-08-30 DIAGNOSIS — R0600 Dyspnea, unspecified: Secondary | ICD-10-CM | POA: Diagnosis not present

## 2019-08-30 DIAGNOSIS — I959 Hypotension, unspecified: Secondary | ICD-10-CM | POA: Diagnosis not present

## 2019-08-30 DIAGNOSIS — R42 Dizziness and giddiness: Secondary | ICD-10-CM | POA: Diagnosis not present

## 2019-08-30 DIAGNOSIS — N182 Chronic kidney disease, stage 2 (mild): Secondary | ICD-10-CM | POA: Diagnosis not present

## 2019-08-30 DIAGNOSIS — D649 Anemia, unspecified: Secondary | ICD-10-CM | POA: Diagnosis not present

## 2019-09-03 DIAGNOSIS — D631 Anemia in chronic kidney disease: Secondary | ICD-10-CM | POA: Diagnosis not present

## 2019-09-03 DIAGNOSIS — N182 Chronic kidney disease, stage 2 (mild): Secondary | ICD-10-CM | POA: Diagnosis not present

## 2019-09-03 DIAGNOSIS — M170 Bilateral primary osteoarthritis of knee: Secondary | ICD-10-CM | POA: Diagnosis not present

## 2019-09-03 DIAGNOSIS — I129 Hypertensive chronic kidney disease with stage 1 through stage 4 chronic kidney disease, or unspecified chronic kidney disease: Secondary | ICD-10-CM | POA: Diagnosis not present

## 2019-09-03 DIAGNOSIS — G3184 Mild cognitive impairment, so stated: Secondary | ICD-10-CM | POA: Diagnosis not present

## 2019-09-03 DIAGNOSIS — E039 Hypothyroidism, unspecified: Secondary | ICD-10-CM | POA: Diagnosis not present

## 2019-09-03 DIAGNOSIS — I272 Pulmonary hypertension, unspecified: Secondary | ICD-10-CM | POA: Diagnosis not present

## 2019-09-03 DIAGNOSIS — B029 Zoster without complications: Secondary | ICD-10-CM | POA: Diagnosis not present

## 2019-09-03 DIAGNOSIS — E538 Deficiency of other specified B group vitamins: Secondary | ICD-10-CM | POA: Diagnosis not present

## 2019-09-04 DIAGNOSIS — I129 Hypertensive chronic kidney disease with stage 1 through stage 4 chronic kidney disease, or unspecified chronic kidney disease: Secondary | ICD-10-CM | POA: Diagnosis not present

## 2019-09-04 DIAGNOSIS — I272 Pulmonary hypertension, unspecified: Secondary | ICD-10-CM | POA: Diagnosis not present

## 2019-09-04 DIAGNOSIS — D631 Anemia in chronic kidney disease: Secondary | ICD-10-CM | POA: Diagnosis not present

## 2019-09-04 DIAGNOSIS — M170 Bilateral primary osteoarthritis of knee: Secondary | ICD-10-CM | POA: Diagnosis not present

## 2019-09-04 DIAGNOSIS — E039 Hypothyroidism, unspecified: Secondary | ICD-10-CM | POA: Diagnosis not present

## 2019-09-04 DIAGNOSIS — E538 Deficiency of other specified B group vitamins: Secondary | ICD-10-CM | POA: Diagnosis not present

## 2019-09-04 DIAGNOSIS — G3184 Mild cognitive impairment, so stated: Secondary | ICD-10-CM | POA: Diagnosis not present

## 2019-09-04 DIAGNOSIS — B029 Zoster without complications: Secondary | ICD-10-CM | POA: Diagnosis not present

## 2019-09-04 DIAGNOSIS — N182 Chronic kidney disease, stage 2 (mild): Secondary | ICD-10-CM | POA: Diagnosis not present

## 2019-09-05 DIAGNOSIS — I272 Pulmonary hypertension, unspecified: Secondary | ICD-10-CM | POA: Diagnosis not present

## 2019-09-05 DIAGNOSIS — M170 Bilateral primary osteoarthritis of knee: Secondary | ICD-10-CM | POA: Diagnosis not present

## 2019-09-05 DIAGNOSIS — E039 Hypothyroidism, unspecified: Secondary | ICD-10-CM | POA: Diagnosis not present

## 2019-09-05 DIAGNOSIS — B029 Zoster without complications: Secondary | ICD-10-CM | POA: Diagnosis not present

## 2019-09-05 DIAGNOSIS — D631 Anemia in chronic kidney disease: Secondary | ICD-10-CM | POA: Diagnosis not present

## 2019-09-05 DIAGNOSIS — E538 Deficiency of other specified B group vitamins: Secondary | ICD-10-CM | POA: Diagnosis not present

## 2019-09-05 DIAGNOSIS — I129 Hypertensive chronic kidney disease with stage 1 through stage 4 chronic kidney disease, or unspecified chronic kidney disease: Secondary | ICD-10-CM | POA: Diagnosis not present

## 2019-09-05 DIAGNOSIS — N182 Chronic kidney disease, stage 2 (mild): Secondary | ICD-10-CM | POA: Diagnosis not present

## 2019-09-05 DIAGNOSIS — G3184 Mild cognitive impairment, so stated: Secondary | ICD-10-CM | POA: Diagnosis not present

## 2019-09-06 DIAGNOSIS — M170 Bilateral primary osteoarthritis of knee: Secondary | ICD-10-CM | POA: Diagnosis not present

## 2019-09-06 DIAGNOSIS — N182 Chronic kidney disease, stage 2 (mild): Secondary | ICD-10-CM | POA: Diagnosis not present

## 2019-09-06 DIAGNOSIS — I129 Hypertensive chronic kidney disease with stage 1 through stage 4 chronic kidney disease, or unspecified chronic kidney disease: Secondary | ICD-10-CM | POA: Diagnosis not present

## 2019-09-06 DIAGNOSIS — B029 Zoster without complications: Secondary | ICD-10-CM | POA: Diagnosis not present

## 2019-09-06 DIAGNOSIS — E039 Hypothyroidism, unspecified: Secondary | ICD-10-CM | POA: Diagnosis not present

## 2019-09-06 DIAGNOSIS — D631 Anemia in chronic kidney disease: Secondary | ICD-10-CM | POA: Diagnosis not present

## 2019-09-06 DIAGNOSIS — G3184 Mild cognitive impairment, so stated: Secondary | ICD-10-CM | POA: Diagnosis not present

## 2019-09-06 DIAGNOSIS — E538 Deficiency of other specified B group vitamins: Secondary | ICD-10-CM | POA: Diagnosis not present

## 2019-09-06 DIAGNOSIS — I272 Pulmonary hypertension, unspecified: Secondary | ICD-10-CM | POA: Diagnosis not present

## 2019-09-09 DIAGNOSIS — E039 Hypothyroidism, unspecified: Secondary | ICD-10-CM | POA: Diagnosis not present

## 2019-09-09 DIAGNOSIS — E538 Deficiency of other specified B group vitamins: Secondary | ICD-10-CM | POA: Diagnosis not present

## 2019-09-09 DIAGNOSIS — M170 Bilateral primary osteoarthritis of knee: Secondary | ICD-10-CM | POA: Diagnosis not present

## 2019-09-09 DIAGNOSIS — G3184 Mild cognitive impairment, so stated: Secondary | ICD-10-CM | POA: Diagnosis not present

## 2019-09-09 DIAGNOSIS — D631 Anemia in chronic kidney disease: Secondary | ICD-10-CM | POA: Diagnosis not present

## 2019-09-09 DIAGNOSIS — I272 Pulmonary hypertension, unspecified: Secondary | ICD-10-CM | POA: Diagnosis not present

## 2019-09-09 DIAGNOSIS — I129 Hypertensive chronic kidney disease with stage 1 through stage 4 chronic kidney disease, or unspecified chronic kidney disease: Secondary | ICD-10-CM | POA: Diagnosis not present

## 2019-09-09 DIAGNOSIS — B029 Zoster without complications: Secondary | ICD-10-CM | POA: Diagnosis not present

## 2019-09-09 DIAGNOSIS — N182 Chronic kidney disease, stage 2 (mild): Secondary | ICD-10-CM | POA: Diagnosis not present

## 2019-09-10 DIAGNOSIS — I272 Pulmonary hypertension, unspecified: Secondary | ICD-10-CM | POA: Diagnosis not present

## 2019-09-10 DIAGNOSIS — N182 Chronic kidney disease, stage 2 (mild): Secondary | ICD-10-CM | POA: Diagnosis not present

## 2019-09-10 DIAGNOSIS — G3184 Mild cognitive impairment, so stated: Secondary | ICD-10-CM | POA: Diagnosis not present

## 2019-09-10 DIAGNOSIS — D631 Anemia in chronic kidney disease: Secondary | ICD-10-CM | POA: Diagnosis not present

## 2019-09-10 DIAGNOSIS — E538 Deficiency of other specified B group vitamins: Secondary | ICD-10-CM | POA: Diagnosis not present

## 2019-09-10 DIAGNOSIS — E039 Hypothyroidism, unspecified: Secondary | ICD-10-CM | POA: Diagnosis not present

## 2019-09-10 DIAGNOSIS — B029 Zoster without complications: Secondary | ICD-10-CM | POA: Diagnosis not present

## 2019-09-10 DIAGNOSIS — I129 Hypertensive chronic kidney disease with stage 1 through stage 4 chronic kidney disease, or unspecified chronic kidney disease: Secondary | ICD-10-CM | POA: Diagnosis not present

## 2019-09-10 DIAGNOSIS — M170 Bilateral primary osteoarthritis of knee: Secondary | ICD-10-CM | POA: Diagnosis not present

## 2019-09-12 DIAGNOSIS — I272 Pulmonary hypertension, unspecified: Secondary | ICD-10-CM | POA: Diagnosis not present

## 2019-09-12 DIAGNOSIS — N182 Chronic kidney disease, stage 2 (mild): Secondary | ICD-10-CM | POA: Diagnosis not present

## 2019-09-12 DIAGNOSIS — G3184 Mild cognitive impairment, so stated: Secondary | ICD-10-CM | POA: Diagnosis not present

## 2019-09-12 DIAGNOSIS — B029 Zoster without complications: Secondary | ICD-10-CM | POA: Diagnosis not present

## 2019-09-12 DIAGNOSIS — E039 Hypothyroidism, unspecified: Secondary | ICD-10-CM | POA: Diagnosis not present

## 2019-09-12 DIAGNOSIS — M170 Bilateral primary osteoarthritis of knee: Secondary | ICD-10-CM | POA: Diagnosis not present

## 2019-09-12 DIAGNOSIS — I129 Hypertensive chronic kidney disease with stage 1 through stage 4 chronic kidney disease, or unspecified chronic kidney disease: Secondary | ICD-10-CM | POA: Diagnosis not present

## 2019-09-12 DIAGNOSIS — E538 Deficiency of other specified B group vitamins: Secondary | ICD-10-CM | POA: Diagnosis not present

## 2019-09-12 DIAGNOSIS — D631 Anemia in chronic kidney disease: Secondary | ICD-10-CM | POA: Diagnosis not present

## 2019-09-13 DIAGNOSIS — B029 Zoster without complications: Secondary | ICD-10-CM | POA: Diagnosis not present

## 2019-09-13 DIAGNOSIS — E538 Deficiency of other specified B group vitamins: Secondary | ICD-10-CM | POA: Diagnosis not present

## 2019-09-13 DIAGNOSIS — D631 Anemia in chronic kidney disease: Secondary | ICD-10-CM | POA: Diagnosis not present

## 2019-09-13 DIAGNOSIS — N182 Chronic kidney disease, stage 2 (mild): Secondary | ICD-10-CM | POA: Diagnosis not present

## 2019-09-13 DIAGNOSIS — E039 Hypothyroidism, unspecified: Secondary | ICD-10-CM | POA: Diagnosis not present

## 2019-09-13 DIAGNOSIS — M170 Bilateral primary osteoarthritis of knee: Secondary | ICD-10-CM | POA: Diagnosis not present

## 2019-09-13 DIAGNOSIS — I129 Hypertensive chronic kidney disease with stage 1 through stage 4 chronic kidney disease, or unspecified chronic kidney disease: Secondary | ICD-10-CM | POA: Diagnosis not present

## 2019-09-13 DIAGNOSIS — I272 Pulmonary hypertension, unspecified: Secondary | ICD-10-CM | POA: Diagnosis not present

## 2019-09-13 DIAGNOSIS — G3184 Mild cognitive impairment, so stated: Secondary | ICD-10-CM | POA: Diagnosis not present

## 2019-09-16 DIAGNOSIS — I272 Pulmonary hypertension, unspecified: Secondary | ICD-10-CM | POA: Diagnosis not present

## 2019-09-16 DIAGNOSIS — N182 Chronic kidney disease, stage 2 (mild): Secondary | ICD-10-CM | POA: Diagnosis not present

## 2019-09-16 DIAGNOSIS — E039 Hypothyroidism, unspecified: Secondary | ICD-10-CM | POA: Diagnosis not present

## 2019-09-16 DIAGNOSIS — I129 Hypertensive chronic kidney disease with stage 1 through stage 4 chronic kidney disease, or unspecified chronic kidney disease: Secondary | ICD-10-CM | POA: Diagnosis not present

## 2019-09-16 DIAGNOSIS — B029 Zoster without complications: Secondary | ICD-10-CM | POA: Diagnosis not present

## 2019-09-16 DIAGNOSIS — G3184 Mild cognitive impairment, so stated: Secondary | ICD-10-CM | POA: Diagnosis not present

## 2019-09-16 DIAGNOSIS — M170 Bilateral primary osteoarthritis of knee: Secondary | ICD-10-CM | POA: Diagnosis not present

## 2019-09-16 DIAGNOSIS — D631 Anemia in chronic kidney disease: Secondary | ICD-10-CM | POA: Diagnosis not present

## 2019-09-16 DIAGNOSIS — E538 Deficiency of other specified B group vitamins: Secondary | ICD-10-CM | POA: Diagnosis not present

## 2019-09-17 DIAGNOSIS — G3184 Mild cognitive impairment, so stated: Secondary | ICD-10-CM | POA: Diagnosis not present

## 2019-09-17 DIAGNOSIS — I272 Pulmonary hypertension, unspecified: Secondary | ICD-10-CM | POA: Diagnosis not present

## 2019-09-17 DIAGNOSIS — E538 Deficiency of other specified B group vitamins: Secondary | ICD-10-CM | POA: Diagnosis not present

## 2019-09-17 DIAGNOSIS — M170 Bilateral primary osteoarthritis of knee: Secondary | ICD-10-CM | POA: Diagnosis not present

## 2019-09-17 DIAGNOSIS — E039 Hypothyroidism, unspecified: Secondary | ICD-10-CM | POA: Diagnosis not present

## 2019-09-17 DIAGNOSIS — N182 Chronic kidney disease, stage 2 (mild): Secondary | ICD-10-CM | POA: Diagnosis not present

## 2019-09-17 DIAGNOSIS — D631 Anemia in chronic kidney disease: Secondary | ICD-10-CM | POA: Diagnosis not present

## 2019-09-17 DIAGNOSIS — B029 Zoster without complications: Secondary | ICD-10-CM | POA: Diagnosis not present

## 2019-09-17 DIAGNOSIS — I129 Hypertensive chronic kidney disease with stage 1 through stage 4 chronic kidney disease, or unspecified chronic kidney disease: Secondary | ICD-10-CM | POA: Diagnosis not present

## 2019-09-19 DIAGNOSIS — E538 Deficiency of other specified B group vitamins: Secondary | ICD-10-CM | POA: Diagnosis not present

## 2019-09-19 DIAGNOSIS — D631 Anemia in chronic kidney disease: Secondary | ICD-10-CM | POA: Diagnosis not present

## 2019-09-19 DIAGNOSIS — I129 Hypertensive chronic kidney disease with stage 1 through stage 4 chronic kidney disease, or unspecified chronic kidney disease: Secondary | ICD-10-CM | POA: Diagnosis not present

## 2019-09-19 DIAGNOSIS — N182 Chronic kidney disease, stage 2 (mild): Secondary | ICD-10-CM | POA: Diagnosis not present

## 2019-09-19 DIAGNOSIS — M170 Bilateral primary osteoarthritis of knee: Secondary | ICD-10-CM | POA: Diagnosis not present

## 2019-09-19 DIAGNOSIS — E039 Hypothyroidism, unspecified: Secondary | ICD-10-CM | POA: Diagnosis not present

## 2019-09-19 DIAGNOSIS — B029 Zoster without complications: Secondary | ICD-10-CM | POA: Diagnosis not present

## 2019-09-19 DIAGNOSIS — G3184 Mild cognitive impairment, so stated: Secondary | ICD-10-CM | POA: Diagnosis not present

## 2019-09-19 DIAGNOSIS — I272 Pulmonary hypertension, unspecified: Secondary | ICD-10-CM | POA: Diagnosis not present

## 2019-09-20 DIAGNOSIS — D631 Anemia in chronic kidney disease: Secondary | ICD-10-CM | POA: Diagnosis not present

## 2019-09-20 DIAGNOSIS — G3184 Mild cognitive impairment, so stated: Secondary | ICD-10-CM | POA: Diagnosis not present

## 2019-09-20 DIAGNOSIS — E039 Hypothyroidism, unspecified: Secondary | ICD-10-CM | POA: Diagnosis not present

## 2019-09-20 DIAGNOSIS — N182 Chronic kidney disease, stage 2 (mild): Secondary | ICD-10-CM | POA: Diagnosis not present

## 2019-09-20 DIAGNOSIS — B029 Zoster without complications: Secondary | ICD-10-CM | POA: Diagnosis not present

## 2019-09-20 DIAGNOSIS — E538 Deficiency of other specified B group vitamins: Secondary | ICD-10-CM | POA: Diagnosis not present

## 2019-09-20 DIAGNOSIS — I129 Hypertensive chronic kidney disease with stage 1 through stage 4 chronic kidney disease, or unspecified chronic kidney disease: Secondary | ICD-10-CM | POA: Diagnosis not present

## 2019-09-20 DIAGNOSIS — M170 Bilateral primary osteoarthritis of knee: Secondary | ICD-10-CM | POA: Diagnosis not present

## 2019-09-20 DIAGNOSIS — I272 Pulmonary hypertension, unspecified: Secondary | ICD-10-CM | POA: Diagnosis not present

## 2019-09-23 DIAGNOSIS — B029 Zoster without complications: Secondary | ICD-10-CM | POA: Diagnosis not present

## 2019-09-23 DIAGNOSIS — I272 Pulmonary hypertension, unspecified: Secondary | ICD-10-CM | POA: Diagnosis not present

## 2019-09-23 DIAGNOSIS — E039 Hypothyroidism, unspecified: Secondary | ICD-10-CM | POA: Diagnosis not present

## 2019-09-23 DIAGNOSIS — D631 Anemia in chronic kidney disease: Secondary | ICD-10-CM | POA: Diagnosis not present

## 2019-09-23 DIAGNOSIS — M170 Bilateral primary osteoarthritis of knee: Secondary | ICD-10-CM | POA: Diagnosis not present

## 2019-09-23 DIAGNOSIS — G3184 Mild cognitive impairment, so stated: Secondary | ICD-10-CM | POA: Diagnosis not present

## 2019-09-23 DIAGNOSIS — I129 Hypertensive chronic kidney disease with stage 1 through stage 4 chronic kidney disease, or unspecified chronic kidney disease: Secondary | ICD-10-CM | POA: Diagnosis not present

## 2019-09-23 DIAGNOSIS — N182 Chronic kidney disease, stage 2 (mild): Secondary | ICD-10-CM | POA: Diagnosis not present

## 2019-09-23 DIAGNOSIS — E538 Deficiency of other specified B group vitamins: Secondary | ICD-10-CM | POA: Diagnosis not present

## 2019-09-25 DIAGNOSIS — G3184 Mild cognitive impairment, so stated: Secondary | ICD-10-CM | POA: Diagnosis not present

## 2019-09-25 DIAGNOSIS — E039 Hypothyroidism, unspecified: Secondary | ICD-10-CM | POA: Diagnosis not present

## 2019-09-25 DIAGNOSIS — I129 Hypertensive chronic kidney disease with stage 1 through stage 4 chronic kidney disease, or unspecified chronic kidney disease: Secondary | ICD-10-CM | POA: Diagnosis not present

## 2019-09-25 DIAGNOSIS — B029 Zoster without complications: Secondary | ICD-10-CM | POA: Diagnosis not present

## 2019-09-25 DIAGNOSIS — N182 Chronic kidney disease, stage 2 (mild): Secondary | ICD-10-CM | POA: Diagnosis not present

## 2019-09-25 DIAGNOSIS — I272 Pulmonary hypertension, unspecified: Secondary | ICD-10-CM | POA: Diagnosis not present

## 2019-09-25 DIAGNOSIS — E538 Deficiency of other specified B group vitamins: Secondary | ICD-10-CM | POA: Diagnosis not present

## 2019-09-25 DIAGNOSIS — M170 Bilateral primary osteoarthritis of knee: Secondary | ICD-10-CM | POA: Diagnosis not present

## 2019-09-25 DIAGNOSIS — D631 Anemia in chronic kidney disease: Secondary | ICD-10-CM | POA: Diagnosis not present

## 2019-09-26 DIAGNOSIS — B029 Zoster without complications: Secondary | ICD-10-CM | POA: Diagnosis not present

## 2019-09-26 DIAGNOSIS — I272 Pulmonary hypertension, unspecified: Secondary | ICD-10-CM | POA: Diagnosis not present

## 2019-09-26 DIAGNOSIS — G3184 Mild cognitive impairment, so stated: Secondary | ICD-10-CM | POA: Diagnosis not present

## 2019-09-26 DIAGNOSIS — E039 Hypothyroidism, unspecified: Secondary | ICD-10-CM | POA: Diagnosis not present

## 2019-09-26 DIAGNOSIS — N182 Chronic kidney disease, stage 2 (mild): Secondary | ICD-10-CM | POA: Diagnosis not present

## 2019-09-26 DIAGNOSIS — M170 Bilateral primary osteoarthritis of knee: Secondary | ICD-10-CM | POA: Diagnosis not present

## 2019-09-26 DIAGNOSIS — I129 Hypertensive chronic kidney disease with stage 1 through stage 4 chronic kidney disease, or unspecified chronic kidney disease: Secondary | ICD-10-CM | POA: Diagnosis not present

## 2019-09-26 DIAGNOSIS — D631 Anemia in chronic kidney disease: Secondary | ICD-10-CM | POA: Diagnosis not present

## 2019-09-26 DIAGNOSIS — E538 Deficiency of other specified B group vitamins: Secondary | ICD-10-CM | POA: Diagnosis not present

## 2019-09-27 DIAGNOSIS — N182 Chronic kidney disease, stage 2 (mild): Secondary | ICD-10-CM | POA: Diagnosis not present

## 2019-09-27 DIAGNOSIS — M170 Bilateral primary osteoarthritis of knee: Secondary | ICD-10-CM | POA: Diagnosis not present

## 2019-09-27 DIAGNOSIS — E538 Deficiency of other specified B group vitamins: Secondary | ICD-10-CM | POA: Diagnosis not present

## 2019-09-27 DIAGNOSIS — I272 Pulmonary hypertension, unspecified: Secondary | ICD-10-CM | POA: Diagnosis not present

## 2019-09-27 DIAGNOSIS — G3184 Mild cognitive impairment, so stated: Secondary | ICD-10-CM | POA: Diagnosis not present

## 2019-09-27 DIAGNOSIS — E039 Hypothyroidism, unspecified: Secondary | ICD-10-CM | POA: Diagnosis not present

## 2019-09-27 DIAGNOSIS — D631 Anemia in chronic kidney disease: Secondary | ICD-10-CM | POA: Diagnosis not present

## 2019-09-27 DIAGNOSIS — B029 Zoster without complications: Secondary | ICD-10-CM | POA: Diagnosis not present

## 2019-09-27 DIAGNOSIS — I129 Hypertensive chronic kidney disease with stage 1 through stage 4 chronic kidney disease, or unspecified chronic kidney disease: Secondary | ICD-10-CM | POA: Diagnosis not present

## 2019-09-30 DIAGNOSIS — B029 Zoster without complications: Secondary | ICD-10-CM | POA: Diagnosis not present

## 2019-09-30 DIAGNOSIS — G3184 Mild cognitive impairment, so stated: Secondary | ICD-10-CM | POA: Diagnosis not present

## 2019-09-30 DIAGNOSIS — M170 Bilateral primary osteoarthritis of knee: Secondary | ICD-10-CM | POA: Diagnosis not present

## 2019-09-30 DIAGNOSIS — E039 Hypothyroidism, unspecified: Secondary | ICD-10-CM | POA: Diagnosis not present

## 2019-09-30 DIAGNOSIS — D631 Anemia in chronic kidney disease: Secondary | ICD-10-CM | POA: Diagnosis not present

## 2019-09-30 DIAGNOSIS — I129 Hypertensive chronic kidney disease with stage 1 through stage 4 chronic kidney disease, or unspecified chronic kidney disease: Secondary | ICD-10-CM | POA: Diagnosis not present

## 2019-09-30 DIAGNOSIS — E538 Deficiency of other specified B group vitamins: Secondary | ICD-10-CM | POA: Diagnosis not present

## 2019-09-30 DIAGNOSIS — I272 Pulmonary hypertension, unspecified: Secondary | ICD-10-CM | POA: Diagnosis not present

## 2019-09-30 DIAGNOSIS — N182 Chronic kidney disease, stage 2 (mild): Secondary | ICD-10-CM | POA: Diagnosis not present

## 2019-10-01 DIAGNOSIS — M170 Bilateral primary osteoarthritis of knee: Secondary | ICD-10-CM | POA: Diagnosis not present

## 2019-10-01 DIAGNOSIS — E538 Deficiency of other specified B group vitamins: Secondary | ICD-10-CM | POA: Diagnosis not present

## 2019-10-01 DIAGNOSIS — I129 Hypertensive chronic kidney disease with stage 1 through stage 4 chronic kidney disease, or unspecified chronic kidney disease: Secondary | ICD-10-CM | POA: Diagnosis not present

## 2019-10-01 DIAGNOSIS — I272 Pulmonary hypertension, unspecified: Secondary | ICD-10-CM | POA: Diagnosis not present

## 2019-10-01 DIAGNOSIS — B029 Zoster without complications: Secondary | ICD-10-CM | POA: Diagnosis not present

## 2019-10-01 DIAGNOSIS — G3184 Mild cognitive impairment, so stated: Secondary | ICD-10-CM | POA: Diagnosis not present

## 2019-10-01 DIAGNOSIS — N182 Chronic kidney disease, stage 2 (mild): Secondary | ICD-10-CM | POA: Diagnosis not present

## 2019-10-01 DIAGNOSIS — E039 Hypothyroidism, unspecified: Secondary | ICD-10-CM | POA: Diagnosis not present

## 2019-10-01 DIAGNOSIS — D631 Anemia in chronic kidney disease: Secondary | ICD-10-CM | POA: Diagnosis not present

## 2019-10-02 DIAGNOSIS — I272 Pulmonary hypertension, unspecified: Secondary | ICD-10-CM | POA: Diagnosis not present

## 2019-10-02 DIAGNOSIS — N182 Chronic kidney disease, stage 2 (mild): Secondary | ICD-10-CM | POA: Diagnosis not present

## 2019-10-02 DIAGNOSIS — B029 Zoster without complications: Secondary | ICD-10-CM | POA: Diagnosis not present

## 2019-10-02 DIAGNOSIS — M170 Bilateral primary osteoarthritis of knee: Secondary | ICD-10-CM | POA: Diagnosis not present

## 2019-10-02 DIAGNOSIS — E039 Hypothyroidism, unspecified: Secondary | ICD-10-CM | POA: Diagnosis not present

## 2019-10-02 DIAGNOSIS — G3184 Mild cognitive impairment, so stated: Secondary | ICD-10-CM | POA: Diagnosis not present

## 2019-10-02 DIAGNOSIS — I129 Hypertensive chronic kidney disease with stage 1 through stage 4 chronic kidney disease, or unspecified chronic kidney disease: Secondary | ICD-10-CM | POA: Diagnosis not present

## 2019-10-02 DIAGNOSIS — E538 Deficiency of other specified B group vitamins: Secondary | ICD-10-CM | POA: Diagnosis not present

## 2019-10-02 DIAGNOSIS — D631 Anemia in chronic kidney disease: Secondary | ICD-10-CM | POA: Diagnosis not present

## 2019-10-04 DIAGNOSIS — I129 Hypertensive chronic kidney disease with stage 1 through stage 4 chronic kidney disease, or unspecified chronic kidney disease: Secondary | ICD-10-CM | POA: Diagnosis not present

## 2019-10-04 DIAGNOSIS — M170 Bilateral primary osteoarthritis of knee: Secondary | ICD-10-CM | POA: Diagnosis not present

## 2019-10-04 DIAGNOSIS — E538 Deficiency of other specified B group vitamins: Secondary | ICD-10-CM | POA: Diagnosis not present

## 2019-10-04 DIAGNOSIS — I272 Pulmonary hypertension, unspecified: Secondary | ICD-10-CM | POA: Diagnosis not present

## 2019-10-04 DIAGNOSIS — B029 Zoster without complications: Secondary | ICD-10-CM | POA: Diagnosis not present

## 2019-10-04 DIAGNOSIS — G3184 Mild cognitive impairment, so stated: Secondary | ICD-10-CM | POA: Diagnosis not present

## 2019-10-04 DIAGNOSIS — E039 Hypothyroidism, unspecified: Secondary | ICD-10-CM | POA: Diagnosis not present

## 2019-10-04 DIAGNOSIS — N182 Chronic kidney disease, stage 2 (mild): Secondary | ICD-10-CM | POA: Diagnosis not present

## 2019-10-04 DIAGNOSIS — D631 Anemia in chronic kidney disease: Secondary | ICD-10-CM | POA: Diagnosis not present

## 2019-10-07 DIAGNOSIS — E538 Deficiency of other specified B group vitamins: Secondary | ICD-10-CM | POA: Diagnosis not present

## 2019-10-07 DIAGNOSIS — B029 Zoster without complications: Secondary | ICD-10-CM | POA: Diagnosis not present

## 2019-10-07 DIAGNOSIS — D631 Anemia in chronic kidney disease: Secondary | ICD-10-CM | POA: Diagnosis not present

## 2019-10-07 DIAGNOSIS — I129 Hypertensive chronic kidney disease with stage 1 through stage 4 chronic kidney disease, or unspecified chronic kidney disease: Secondary | ICD-10-CM | POA: Diagnosis not present

## 2019-10-07 DIAGNOSIS — M170 Bilateral primary osteoarthritis of knee: Secondary | ICD-10-CM | POA: Diagnosis not present

## 2019-10-07 DIAGNOSIS — N182 Chronic kidney disease, stage 2 (mild): Secondary | ICD-10-CM | POA: Diagnosis not present

## 2019-10-07 DIAGNOSIS — G3184 Mild cognitive impairment, so stated: Secondary | ICD-10-CM | POA: Diagnosis not present

## 2019-10-07 DIAGNOSIS — I272 Pulmonary hypertension, unspecified: Secondary | ICD-10-CM | POA: Diagnosis not present

## 2019-10-07 DIAGNOSIS — E039 Hypothyroidism, unspecified: Secondary | ICD-10-CM | POA: Diagnosis not present

## 2019-10-08 DIAGNOSIS — G3184 Mild cognitive impairment, so stated: Secondary | ICD-10-CM | POA: Diagnosis not present

## 2019-10-08 DIAGNOSIS — I129 Hypertensive chronic kidney disease with stage 1 through stage 4 chronic kidney disease, or unspecified chronic kidney disease: Secondary | ICD-10-CM | POA: Diagnosis not present

## 2019-10-08 DIAGNOSIS — G8929 Other chronic pain: Secondary | ICD-10-CM | POA: Diagnosis not present

## 2019-10-08 DIAGNOSIS — D631 Anemia in chronic kidney disease: Secondary | ICD-10-CM | POA: Diagnosis not present

## 2019-10-08 DIAGNOSIS — E538 Deficiency of other specified B group vitamins: Secondary | ICD-10-CM | POA: Diagnosis not present

## 2019-10-08 DIAGNOSIS — N182 Chronic kidney disease, stage 2 (mild): Secondary | ICD-10-CM | POA: Diagnosis not present

## 2019-10-08 DIAGNOSIS — M170 Bilateral primary osteoarthritis of knee: Secondary | ICD-10-CM | POA: Diagnosis not present

## 2019-10-08 DIAGNOSIS — B029 Zoster without complications: Secondary | ICD-10-CM | POA: Diagnosis not present

## 2019-10-08 DIAGNOSIS — E039 Hypothyroidism, unspecified: Secondary | ICD-10-CM | POA: Diagnosis not present

## 2019-10-08 DIAGNOSIS — I272 Pulmonary hypertension, unspecified: Secondary | ICD-10-CM | POA: Diagnosis not present

## 2019-10-08 DIAGNOSIS — M25561 Pain in right knee: Secondary | ICD-10-CM | POA: Diagnosis not present

## 2019-10-08 DIAGNOSIS — M25562 Pain in left knee: Secondary | ICD-10-CM | POA: Diagnosis not present

## 2019-10-15 DIAGNOSIS — E039 Hypothyroidism, unspecified: Secondary | ICD-10-CM | POA: Diagnosis not present

## 2019-10-15 DIAGNOSIS — G3184 Mild cognitive impairment, so stated: Secondary | ICD-10-CM | POA: Diagnosis not present

## 2019-10-15 DIAGNOSIS — D631 Anemia in chronic kidney disease: Secondary | ICD-10-CM | POA: Diagnosis not present

## 2019-10-15 DIAGNOSIS — N182 Chronic kidney disease, stage 2 (mild): Secondary | ICD-10-CM | POA: Diagnosis not present

## 2019-10-15 DIAGNOSIS — M170 Bilateral primary osteoarthritis of knee: Secondary | ICD-10-CM | POA: Diagnosis not present

## 2019-10-15 DIAGNOSIS — I129 Hypertensive chronic kidney disease with stage 1 through stage 4 chronic kidney disease, or unspecified chronic kidney disease: Secondary | ICD-10-CM | POA: Diagnosis not present

## 2019-10-15 DIAGNOSIS — E538 Deficiency of other specified B group vitamins: Secondary | ICD-10-CM | POA: Diagnosis not present

## 2019-10-15 DIAGNOSIS — B029 Zoster without complications: Secondary | ICD-10-CM | POA: Diagnosis not present

## 2019-10-15 DIAGNOSIS — I272 Pulmonary hypertension, unspecified: Secondary | ICD-10-CM | POA: Diagnosis not present

## 2019-10-18 DIAGNOSIS — R413 Other amnesia: Secondary | ICD-10-CM | POA: Diagnosis not present

## 2019-10-18 DIAGNOSIS — R296 Repeated falls: Secondary | ICD-10-CM | POA: Diagnosis not present

## 2019-10-18 DIAGNOSIS — R269 Unspecified abnormalities of gait and mobility: Secondary | ICD-10-CM | POA: Diagnosis not present

## 2019-10-18 DIAGNOSIS — I10 Essential (primary) hypertension: Secondary | ICD-10-CM | POA: Diagnosis not present

## 2019-10-18 DIAGNOSIS — M170 Bilateral primary osteoarthritis of knee: Secondary | ICD-10-CM | POA: Diagnosis not present

## 2019-10-25 DIAGNOSIS — E039 Hypothyroidism, unspecified: Secondary | ICD-10-CM | POA: Diagnosis not present

## 2019-10-25 DIAGNOSIS — I272 Pulmonary hypertension, unspecified: Secondary | ICD-10-CM | POA: Diagnosis not present

## 2019-10-25 DIAGNOSIS — I129 Hypertensive chronic kidney disease with stage 1 through stage 4 chronic kidney disease, or unspecified chronic kidney disease: Secondary | ICD-10-CM | POA: Diagnosis not present

## 2019-10-25 DIAGNOSIS — G3184 Mild cognitive impairment, so stated: Secondary | ICD-10-CM | POA: Diagnosis not present

## 2019-10-25 DIAGNOSIS — N182 Chronic kidney disease, stage 2 (mild): Secondary | ICD-10-CM | POA: Diagnosis not present

## 2019-10-25 DIAGNOSIS — B029 Zoster without complications: Secondary | ICD-10-CM | POA: Diagnosis not present

## 2019-10-25 DIAGNOSIS — D631 Anemia in chronic kidney disease: Secondary | ICD-10-CM | POA: Diagnosis not present

## 2019-10-25 DIAGNOSIS — M170 Bilateral primary osteoarthritis of knee: Secondary | ICD-10-CM | POA: Diagnosis not present

## 2019-10-25 DIAGNOSIS — E538 Deficiency of other specified B group vitamins: Secondary | ICD-10-CM | POA: Diagnosis not present

## 2019-11-06 DIAGNOSIS — R42 Dizziness and giddiness: Secondary | ICD-10-CM | POA: Diagnosis not present

## 2019-11-06 DIAGNOSIS — R413 Other amnesia: Secondary | ICD-10-CM | POA: Diagnosis not present

## 2019-11-18 DIAGNOSIS — R413 Other amnesia: Secondary | ICD-10-CM | POA: Diagnosis not present

## 2019-11-18 DIAGNOSIS — E039 Hypothyroidism, unspecified: Secondary | ICD-10-CM | POA: Diagnosis not present

## 2019-11-18 DIAGNOSIS — R7309 Other abnormal glucose: Secondary | ICD-10-CM | POA: Diagnosis not present

## 2019-11-18 DIAGNOSIS — I272 Pulmonary hypertension, unspecified: Secondary | ICD-10-CM | POA: Diagnosis not present

## 2019-11-18 DIAGNOSIS — M170 Bilateral primary osteoarthritis of knee: Secondary | ICD-10-CM | POA: Diagnosis not present

## 2019-11-18 DIAGNOSIS — E538 Deficiency of other specified B group vitamins: Secondary | ICD-10-CM | POA: Diagnosis not present

## 2019-11-18 DIAGNOSIS — R42 Dizziness and giddiness: Secondary | ICD-10-CM | POA: Diagnosis not present

## 2019-11-18 DIAGNOSIS — R269 Unspecified abnormalities of gait and mobility: Secondary | ICD-10-CM | POA: Diagnosis not present

## 2019-11-18 DIAGNOSIS — I10 Essential (primary) hypertension: Secondary | ICD-10-CM | POA: Diagnosis not present

## 2019-11-18 DIAGNOSIS — Z1331 Encounter for screening for depression: Secondary | ICD-10-CM | POA: Diagnosis not present

## 2019-11-18 DIAGNOSIS — E7800 Pure hypercholesterolemia, unspecified: Secondary | ICD-10-CM | POA: Diagnosis not present

## 2019-11-18 DIAGNOSIS — Z0000 Encounter for general adult medical examination without abnormal findings: Secondary | ICD-10-CM | POA: Diagnosis not present

## 2019-11-18 DIAGNOSIS — R7302 Impaired glucose tolerance (oral): Secondary | ICD-10-CM | POA: Diagnosis not present

## 2019-11-25 DIAGNOSIS — D649 Anemia, unspecified: Secondary | ICD-10-CM | POA: Diagnosis not present

## 2019-11-25 DIAGNOSIS — R600 Localized edema: Secondary | ICD-10-CM | POA: Diagnosis not present

## 2019-11-25 DIAGNOSIS — E7800 Pure hypercholesterolemia, unspecified: Secondary | ICD-10-CM | POA: Diagnosis not present

## 2019-11-25 DIAGNOSIS — I10 Essential (primary) hypertension: Secondary | ICD-10-CM | POA: Diagnosis not present

## 2019-11-25 DIAGNOSIS — R0602 Shortness of breath: Secondary | ICD-10-CM | POA: Diagnosis not present

## 2019-11-25 DIAGNOSIS — R42 Dizziness and giddiness: Secondary | ICD-10-CM | POA: Diagnosis not present

## 2019-11-25 DIAGNOSIS — E079 Disorder of thyroid, unspecified: Secondary | ICD-10-CM | POA: Diagnosis not present

## 2019-11-25 DIAGNOSIS — I38 Endocarditis, valve unspecified: Secondary | ICD-10-CM | POA: Diagnosis not present

## 2019-11-25 DIAGNOSIS — N189 Chronic kidney disease, unspecified: Secondary | ICD-10-CM | POA: Diagnosis not present

## 2019-12-23 DIAGNOSIS — M170 Bilateral primary osteoarthritis of knee: Secondary | ICD-10-CM | POA: Diagnosis not present

## 2019-12-25 DIAGNOSIS — I7389 Other specified peripheral vascular diseases: Secondary | ICD-10-CM | POA: Diagnosis not present

## 2019-12-25 DIAGNOSIS — R600 Localized edema: Secondary | ICD-10-CM | POA: Diagnosis not present

## 2019-12-30 DIAGNOSIS — M25473 Effusion, unspecified ankle: Secondary | ICD-10-CM | POA: Diagnosis not present

## 2020-01-16 DIAGNOSIS — R42 Dizziness and giddiness: Secondary | ICD-10-CM | POA: Diagnosis not present

## 2020-01-29 DIAGNOSIS — I129 Hypertensive chronic kidney disease with stage 1 through stage 4 chronic kidney disease, or unspecified chronic kidney disease: Secondary | ICD-10-CM | POA: Diagnosis not present

## 2020-01-29 DIAGNOSIS — N182 Chronic kidney disease, stage 2 (mild): Secondary | ICD-10-CM | POA: Diagnosis not present

## 2020-01-29 DIAGNOSIS — D631 Anemia in chronic kidney disease: Secondary | ICD-10-CM | POA: Diagnosis not present

## 2020-01-29 DIAGNOSIS — E538 Deficiency of other specified B group vitamins: Secondary | ICD-10-CM | POA: Diagnosis not present

## 2020-01-29 DIAGNOSIS — M170 Bilateral primary osteoarthritis of knee: Secondary | ICD-10-CM | POA: Diagnosis not present

## 2020-01-29 DIAGNOSIS — I272 Pulmonary hypertension, unspecified: Secondary | ICD-10-CM | POA: Diagnosis not present

## 2020-01-29 DIAGNOSIS — G3184 Mild cognitive impairment, so stated: Secondary | ICD-10-CM | POA: Diagnosis not present

## 2020-04-16 ENCOUNTER — Emergency Department: Payer: Medicare HMO

## 2020-04-16 ENCOUNTER — Inpatient Hospital Stay
Admission: EM | Admit: 2020-04-16 | Discharge: 2020-04-18 | DRG: 951 | Disposition: A | Discharge status: Hospice | Payer: Medicare HMO | Attending: Internal Medicine | Admitting: Internal Medicine

## 2020-04-16 DIAGNOSIS — Z20822 Contact with and (suspected) exposure to covid-19: Secondary | ICD-10-CM | POA: Diagnosis present

## 2020-04-16 DIAGNOSIS — S0003XA Contusion of scalp, initial encounter: Secondary | ICD-10-CM | POA: Diagnosis present

## 2020-04-16 DIAGNOSIS — D649 Anemia, unspecified: Secondary | ICD-10-CM | POA: Diagnosis present

## 2020-04-16 DIAGNOSIS — I10 Essential (primary) hypertension: Secondary | ICD-10-CM | POA: Diagnosis present

## 2020-04-16 DIAGNOSIS — Z79890 Hormone replacement therapy (postmenopausal): Secondary | ICD-10-CM | POA: Diagnosis not present

## 2020-04-16 DIAGNOSIS — S06899A Other specified intracranial injury with loss of consciousness of unspecified duration, initial encounter: Secondary | ICD-10-CM | POA: Diagnosis not present

## 2020-04-16 DIAGNOSIS — R279 Unspecified lack of coordination: Secondary | ICD-10-CM | POA: Diagnosis not present

## 2020-04-16 DIAGNOSIS — E872 Acidosis, unspecified: Secondary | ICD-10-CM | POA: Diagnosis present

## 2020-04-16 DIAGNOSIS — M4692 Unspecified inflammatory spondylopathy, cervical region: Secondary | ICD-10-CM | POA: Diagnosis not present

## 2020-04-16 DIAGNOSIS — I611 Nontraumatic intracerebral hemorrhage in hemisphere, cortical: Secondary | ICD-10-CM | POA: Diagnosis present

## 2020-04-16 DIAGNOSIS — Z9842 Cataract extraction status, left eye: Secondary | ICD-10-CM | POA: Diagnosis not present

## 2020-04-16 DIAGNOSIS — Z515 Encounter for palliative care: Principal | ICD-10-CM

## 2020-04-16 DIAGNOSIS — R6510 Systemic inflammatory response syndrome (SIRS) of non-infectious origin without acute organ dysfunction: Secondary | ICD-10-CM | POA: Diagnosis not present

## 2020-04-16 DIAGNOSIS — E785 Hyperlipidemia, unspecified: Secondary | ICD-10-CM | POA: Diagnosis present

## 2020-04-16 DIAGNOSIS — R4020 Unspecified coma: Secondary | ICD-10-CM | POA: Diagnosis not present

## 2020-04-16 DIAGNOSIS — R001 Bradycardia, unspecified: Secondary | ICD-10-CM | POA: Diagnosis not present

## 2020-04-16 DIAGNOSIS — Z79899 Other long term (current) drug therapy: Secondary | ICD-10-CM | POA: Diagnosis not present

## 2020-04-16 DIAGNOSIS — I619 Nontraumatic intracerebral hemorrhage, unspecified: Secondary | ICD-10-CM | POA: Diagnosis not present

## 2020-04-16 DIAGNOSIS — R4182 Altered mental status, unspecified: Secondary | ICD-10-CM | POA: Diagnosis not present

## 2020-04-16 DIAGNOSIS — D1802 Hemangioma of intracranial structures: Secondary | ICD-10-CM | POA: Diagnosis not present

## 2020-04-16 DIAGNOSIS — R40243 Glasgow coma scale score 3-8, unspecified time: Secondary | ICD-10-CM | POA: Diagnosis not present

## 2020-04-16 DIAGNOSIS — Z743 Need for continuous supervision: Secondary | ICD-10-CM | POA: Diagnosis not present

## 2020-04-16 DIAGNOSIS — S06369A Traumatic hemorrhage of cerebrum, unspecified, with loss of consciousness of unspecified duration, initial encounter: Secondary | ICD-10-CM | POA: Diagnosis not present

## 2020-04-16 DIAGNOSIS — Z66 Do not resuscitate: Secondary | ICD-10-CM | POA: Diagnosis present

## 2020-04-16 DIAGNOSIS — Y92009 Unspecified place in unspecified non-institutional (private) residence as the place of occurrence of the external cause: Secondary | ICD-10-CM

## 2020-04-16 DIAGNOSIS — Z9841 Cataract extraction status, right eye: Secondary | ICD-10-CM | POA: Diagnosis not present

## 2020-04-16 DIAGNOSIS — I616 Nontraumatic intracerebral hemorrhage, multiple localized: Secondary | ICD-10-CM | POA: Diagnosis not present

## 2020-04-16 DIAGNOSIS — G936 Cerebral edema: Secondary | ICD-10-CM | POA: Diagnosis not present

## 2020-04-16 DIAGNOSIS — M5030 Other cervical disc degeneration, unspecified cervical region: Secondary | ICD-10-CM | POA: Diagnosis not present

## 2020-04-16 DIAGNOSIS — R52 Pain, unspecified: Secondary | ICD-10-CM | POA: Diagnosis not present

## 2020-04-16 DIAGNOSIS — S0689AA Other specified intracranial injury with loss of consciousness status unknown, initial encounter: Secondary | ICD-10-CM | POA: Insufficient documentation

## 2020-04-16 DIAGNOSIS — R0902 Hypoxemia: Secondary | ICD-10-CM | POA: Diagnosis not present

## 2020-04-16 DIAGNOSIS — S06350A Traumatic hemorrhage of left cerebrum without loss of consciousness, initial encounter: Secondary | ICD-10-CM | POA: Diagnosis not present

## 2020-04-16 DIAGNOSIS — I517 Cardiomegaly: Secondary | ICD-10-CM | POA: Diagnosis not present

## 2020-04-16 DIAGNOSIS — E039 Hypothyroidism, unspecified: Secondary | ICD-10-CM | POA: Diagnosis not present

## 2020-04-16 DIAGNOSIS — R404 Transient alteration of awareness: Secondary | ICD-10-CM | POA: Diagnosis not present

## 2020-04-16 DIAGNOSIS — Z7189 Other specified counseling: Secondary | ICD-10-CM | POA: Diagnosis not present

## 2020-04-16 DIAGNOSIS — R0689 Other abnormalities of breathing: Secondary | ICD-10-CM | POA: Diagnosis not present

## 2020-04-16 LAB — COMPREHENSIVE METABOLIC PANEL
ALT: 16 U/L (ref 0–44)
AST: 37 U/L (ref 15–41)
Albumin: 3.4 g/dL — ABNORMAL LOW (ref 3.5–5.0)
Alkaline Phosphatase: 60 U/L (ref 38–126)
Anion gap: 13 (ref 5–15)
BUN: 15 mg/dL (ref 8–23)
CO2: 23 mmol/L (ref 22–32)
Calcium: 7.9 mg/dL — ABNORMAL LOW (ref 8.9–10.3)
Chloride: 101 mmol/L (ref 98–111)
Creatinine, Ser: 0.73 mg/dL (ref 0.44–1.00)
GFR, Estimated: >60 mL/min (ref >60)
Glucose, Bld: 143 mg/dL — ABNORMAL HIGH (ref 70–99)
Potassium: 3.8 mmol/L (ref 3.5–5.1)
Sodium: 137 mmol/L (ref 135–145)
Total Bilirubin: 0.9 mg/dL (ref 0.3–1.2)
Total Protein: 6.4 g/dL — ABNORMAL LOW (ref 6.5–8.1)

## 2020-04-16 LAB — CBC WITH DIFFERENTIAL/PLATELET
Abs Immature Granulocytes: 0.06 10*3/uL (ref 0.00–0.07)
Basophils Absolute: 0 10*3/uL (ref 0.0–0.1)
Basophils Relative: 0 %
Eosinophils Absolute: 0 10*3/uL (ref 0.0–0.5)
Eosinophils Relative: 0 %
HCT: 33.2 % — ABNORMAL LOW (ref 36.0–46.0)
Hemoglobin: 10.4 g/dL — ABNORMAL LOW (ref 12.0–15.0)
Immature Granulocytes: 0 %
Lymphocytes Relative: 3 %
Lymphs Abs: 0.4 10*3/uL — ABNORMAL LOW (ref 0.7–4.0)
MCH: 28.8 pg (ref 26.0–34.0)
MCHC: 31.3 g/dL (ref 30.0–36.0)
MCV: 92 fL (ref 80.0–100.0)
Monocytes Absolute: 1.3 10*3/uL — ABNORMAL HIGH (ref 0.1–1.0)
Monocytes Relative: 10 %
Neutro Abs: 11.6 10*3/uL — ABNORMAL HIGH (ref 1.7–7.7)
Neutrophils Relative %: 87 %
Platelets: 208 10*3/uL (ref 150–400)
RBC: 3.61 MIL/uL — ABNORMAL LOW (ref 3.87–5.11)
RDW: 13.2 % (ref 11.5–15.5)
WBC: 13.4 10*3/uL — ABNORMAL HIGH (ref 4.0–10.5)
nRBC: 0 % (ref 0.0–0.2)

## 2020-04-16 LAB — LACTIC ACID, PLASMA: Lactic Acid, Venous: 3.9 mmol/L (ref 0.5–1.9)

## 2020-04-16 LAB — URINALYSIS, COMPLETE (UACMP) WITH MICROSCOPIC
Bacteria, UA: NONE SEEN
Bilirubin Urine: NEGATIVE
Glucose, UA: 50 mg/dL — AB
Hgb urine dipstick: NEGATIVE
Ketones, ur: NEGATIVE mg/dL
Leukocytes,Ua: NEGATIVE
Nitrite: NEGATIVE
Protein, ur: 30 mg/dL — AB
Specific Gravity, Urine: 1.02 (ref 1.005–1.030)
pH: 5 (ref 5.0–8.0)

## 2020-04-16 LAB — RESPIRATORY PANEL BY RT PCR (FLU A&B, COVID)
Influenza A by PCR: NEGATIVE
Influenza B by PCR: NEGATIVE
SARS Coronavirus 2 by RT PCR: NEGATIVE

## 2020-04-16 MED ORDER — ACETAMINOPHEN 650 MG RE SUPP
650.0000 mg | RECTAL | Status: DC | PRN
  Administered 2020-04-16: 650 mg via RECTAL
  Filled 2020-04-16: qty 1

## 2020-04-16 MED ORDER — ACETAMINOPHEN 325 MG PO TABS: 650.0000 mg | ORAL_TABLET | Freq: Four times a day (QID) | ORAL | Status: DC | PRN

## 2020-04-16 MED ORDER — HALOPERIDOL LACTATE 5 MG/ML IJ SOLN: 0.5000 mg | INTRAMUSCULAR | Status: DC | PRN

## 2020-04-16 MED ORDER — ORAL CARE MOUTH RINSE
15.0000 mL | OROMUCOSAL | Status: DC
  Administered 2020-04-17 – 2020-04-18 (×10): 15 mL via OROMUCOSAL
  Filled 2020-04-16 (×4): qty 15

## 2020-04-16 MED ORDER — GLYCOPYRROLATE 1 MG PO TABS
1.0000 mg | ORAL_TABLET | ORAL | Status: DC | PRN
  Filled 2020-04-16: qty 1

## 2020-04-16 MED ORDER — POLYVINYL ALCOHOL 1.4 % OP SOLN
1.0000 [drp] | Freq: Four times a day (QID) | OPHTHALMIC | Status: DC | PRN
  Filled 2020-04-16: qty 15

## 2020-04-16 MED ORDER — HALOPERIDOL 0.5 MG PO TABS
0.5000 mg | ORAL_TABLET | ORAL | Status: DC | PRN
  Filled 2020-04-16: qty 1

## 2020-04-16 MED ORDER — GLYCOPYRROLATE 0.2 MG/ML IJ SOLN
0.2000 mg | INTRAMUSCULAR | Status: DC | PRN
  Filled 2020-04-16 (×2): qty 1

## 2020-04-16 MED ORDER — ACETAMINOPHEN 650 MG RE SUPP: 650.0000 mg | Freq: Four times a day (QID) | RECTAL | Status: DC | PRN

## 2020-04-16 MED ORDER — LORAZEPAM 2 MG/ML IJ SOLN: 1.0000 mg | INTRAMUSCULAR | Status: DC | PRN

## 2020-04-16 MED ORDER — GLYCOPYRROLATE 0.2 MG/ML IJ SOLN
0.4000 mg | INTRAMUSCULAR | Status: DC
  Administered 2020-04-16 – 2020-04-18 (×12): 0.4 mg via INTRAVENOUS
  Filled 2020-04-16 (×15): qty 2

## 2020-04-16 MED ORDER — ONDANSETRON HCL 4 MG/2ML IJ SOLN: 4.0000 mg | Freq: Four times a day (QID) | INTRAMUSCULAR | Status: DC | PRN

## 2020-04-16 MED ORDER — ONDANSETRON 4 MG PO TBDP
4.0000 mg | ORAL_TABLET | Freq: Four times a day (QID) | ORAL | Status: DC | PRN
  Filled 2020-04-16: qty 1

## 2020-04-16 MED ORDER — MORPHINE SULFATE (PF) 2 MG/ML IV SOLN: 2.0000 mg | INTRAVENOUS | Status: DC | PRN

## 2020-04-16 MED ORDER — GLYCOPYRROLATE 0.2 MG/ML IJ SOLN
0.2000 mg | INTRAMUSCULAR | Status: DC | PRN
  Filled 2020-04-16: qty 1

## 2020-04-16 MED ORDER — BIOTENE DRY MOUTH MT LIQD
15.0000 mL | OROMUCOSAL | Status: DC | PRN
  Filled 2020-04-16: qty 15

## 2020-04-16 MED ORDER — HALOPERIDOL LACTATE 2 MG/ML PO CONC
0.5000 mg | ORAL | Status: DC | PRN
  Filled 2020-04-16: qty 0.3

## 2020-04-16 NOTE — Consult Note (Signed)
Brief note after discussion with Dr. Corky Downs.  86 yo female who presented with AMS after being found down.  On initial examination, patient is comatose and nonresponsive.  GCS 3.    CT shows L pariental lobe intraparenchymal hemorrhage 7x5x6 cm on my measurement.  She has substantial mass effect and midline shift, and a trapped left temporal horn.  A/P) GCS 3 after large spontaneous IPH.  ICH Score 2 (GCS 3) + 1(size>30 ml) + 1 (age over 22) = 4.  Data shows a mortality rate of 97% in this group.   In this patient, the hemorrage is also in the dominant hemisphere so underlying brain tissue likely subserves speech.  I recommend against surgery.  There is not data to support removal of the clot or decompressive hemicraniectomy.  I would recommend comfort measures.  Meade Maw MD

## 2020-04-16 NOTE — ED Notes (Signed)
X-ray at bedside

## 2020-04-16 NOTE — ED Notes (Signed)
Patient cleaned of incontinence, linens changed.

## 2020-04-16 NOTE — ED Notes (Signed)
Pt resting comfortably, respirations even and unlabored. NAD noted.

## 2020-04-16 NOTE — ED Notes (Signed)
Attempted to call report

## 2020-04-16 NOTE — Consult Note (Signed)
Consultation Note Date: 04/16/2020   Patient Name: Carolyn Fields  DOB: [birth year]  MRN: [id number]  Age / Sex: 86 y.o., female  PCP: Ezequiel Kayser, MD Referring Physician: Ivor Costa, MD  Reason for Consultation: Establishing goals of care  HPI/Patient Profile: 86 y.o. female  with past medical history of hypertension, hyperlipidemia, hypothyroidism admitted on 04/16/2020 with found down at home and found to have large left intracranial hemorrhage to left parietal lobe with midline shift. Found to be very poor surgical candidate.   Clinical Assessment and Goals of Care: I met today at Ms. Phaneuf's bedside with daughter, Stanton Kidney. I had a good and long discussion with [facility]. We reviewed ICH and location, effects, and significant damage to brain. We reviewed expectations with surgical intervention that would likely cause her suffering at end of life. Discussed expectations and progression at end of life. Discussed that her feet are cold to the touch and this is expected. She also has increased secretions and drooling - will add medication to help with this. Discussed risk of aspiration as well.   Stanton Kidney had many good questions. I provided reassurance that comfort care is an appropriate decision. We discussed use of medications to ensure this comfort. Stanton Kidney also asks about hospice and is interested in considering hospice facility or hospice at home if her mother remains stable. I have requested hospice liaison to speak with her tomorrow.   All questions/concerns addressed to the best of my ability. We also discussed how she has had very good quality of life and able to live independently at home right up until this event. Emotional support provided.   Primary Decision Maker NEXT OF KIN adult children    SUMMARY OF RECOMMENDATIONS   - DNR - Full comfort care  Code Status/Advance Care Planning:  DNR   Symptom  Management:   As needed medications ordered for pain, shortness of breath, anxiety, agitation, seizure.   Scheduled Robinul ordered for secretions.   Palliative Prophylaxis:   Frequent Pain Assessment, Oral Care and Turn Reposition  Additional Recommendations (Limitations, Scope, Preferences):  Full Comfort Care  Psycho-social/Spiritual:   Desire for further Chaplaincy support:yes  Additional Recommendations: Education on Hospice and Grief/Bereavement Support  Prognosis:   Hours - Days  Discharge Planning: To Be Determined      Primary Diagnoses: Present on Admission: . Coma ([place]) . ICH (intracerebral hemorrhage) ([place]) . SIRS (systemic inflammatory response syndrome) ([facility]) . Lactic acidosis . HLD (hyperlipidemia) . HTN (hypertension) . Normocytic anemia . Hypothyroidism   I have reviewed the medical record, interviewed the patient and family, and examined the patient. The following aspects are pertinent.  Past Medical History:  Diagnosis Date  . Hyperlipidemia   . Hypertension   . Thyroid disease    hypothyroidism   Social History   Socioeconomic History  . Marital status: Widowed    Spouse name: Not on file  . Number of children: Not on file  . Years of education: Not on file  . Highest education level: Not on file  Occupational History  . Not on file  Tobacco Use  . Smoking status: Never Smoker  . Smokeless tobacco: Never Used  Substance and Sexual Activity  . Alcohol use: No  . Drug use: No  . Sexual activity: Not on file  Other Topics Concern  . Not on file  Social History Narrative  . Not on file   Social Determinants of Health   Financial Resource Strain:   . Difficulty of Paying Living Expenses: Not on file  Food Insecurity:   . Worried About Charity fundraiser in the Last Year: Not on file  . Ran Out of Food in the Last Year: Not on file  Transportation Needs:   . Lack of Transportation (Medical): Not on file  . Lack of  Transportation (Non-Medical): Not on file  Physical Activity:   . Days of Exercise per Week: Not on file  . Minutes of Exercise per Session: Not on file  Stress:   . Feeling of Stress : Not on file  Social Connections:   . Frequency of Communication with Friends and Family: Not on file  . Frequency of Social Gatherings with Friends and Family: Not on file  . Attends Religious Services: Not on file  . Active Member of Clubs or Organizations: Not on file  . Attends Archivist Meetings: Not on file  . Marital Status: Not on file   No family history on file. Scheduled Meds: . glycopyrrolate  0.4 mg Intravenous Q4H   Continuous Infusions: PRN Meds:.antiseptic oral rinse, haloperidol **OR** haloperidol **OR** haloperidol lactate, LORazepam, morphine injection, ondansetron **OR** ondansetron (ZOFRAN) IV, polyvinyl alcohol No Known Allergies Review of Systems  Unable to perform ROS: Acuity of condition    Physical Exam Vitals and nursing note reviewed.  Constitutional:      General: She is not in acute distress.    Appearance: She is ill-appearing.     Comments: Thin, frail   Cardiovascular:     Rate and Rhythm: Normal rate.  Pulmonary:     Effort: No tachypnea, accessory muscle usage or respiratory distress.     Comments: Increased secretions, drooling Abdominal:     General: Abdomen is flat.     Palpations: Abdomen is soft.  Musculoskeletal:     Comments: She does move left toes and hand randomly but no purposeful movement or following commands  Neurological:     Mental Status: She is unresponsive.     Vital Signs: BP (!) 154/85   Pulse 83   Temp 99.5 F (37.5 C) (Rectal)   Resp 18   SpO2 98%  Pain Scale: 0-10   Pain Score: Asleep   SpO2: SpO2: 98 % O2 Device:SpO2: 98 % O2 Flow Rate: .   IO: Intake/output summary: No intake or output data in the 24 hours ending 04/16/20 1655  LBM:   Baseline Weight:   Most recent weight:       Palliative  Assessment/Data: 10%     Time In: 1600 Time Out: 1710 Time Total: 70 min Greater than 50%  of this time was spent counseling and coordinating care related to the above assessment and plan.  Signed by: Vinie Sill, NP Palliative Medicine Team Pager # [phone] (M-F 8a-5p) Team Phone # [phone] (Nights/Weekends)

## 2020-04-16 NOTE — ED Notes (Signed)
Date and time results received: 04/16/20 10:55 AM  (use smartphrase ".now" to insert current time)  Test: Lactic  Critical Value: 3.9  Name of Provider Notified: Dr. Corky Downs   Orders Received? Or Actions Taken?: No new orders at this time

## 2020-04-16 NOTE — ED Triage Notes (Addendum)
Pt to ED via [facility] from apartment. Pt lives home alone when family found her this morning unresponsive on the floor. Pt initial RA sats 90% and dropped to 60%. Pt placed on NRD with improvement to 92%. EMS statins pupils nonreactive. CBG 229.   Upon arrival pt with snoring respirations but responsive to verbal stimuli. RA sats 100%.

## 2020-04-16 NOTE — H&P (Signed)
History and Physical    Carolyn Fields UVO:[id number] DOB: 05/02/1923 DOA: 04/16/2020  Referring MD/NP/PA:   PCP: Ezequiel Kayser, MD   Patient coming from:  The patient is coming from home.  At baseline, pt is partially dependent for most of ADL.        Chief Complaint: Unresponsiveness  HPI: Carolyn Fields is a 86 y.o. female with medical history significant of hypertension, hyperlipidemia, hypothyroidism, who presents with unresponsiveness.  Per pt's daughter at bedside, pt was found unresponsive on the floor this morning.  Patient slightly moves extremities.  Found to have oxygen desaturation to 60s% on room air, which improved to 97% on 3 L oxygen.  Per her daughter, patient does not seem to have chest pain, active respiratory distress, cough, nausea, vomiting, diarrhea. When I saw pt in ED, she is in coma, and unresponsive to painful stimuli, not arousable.  No facial droop noted.  ED Course: pt was found to have WBC 13.4, lactic acid 3.9, negative Covid PCR, negative urinalysis, electrolytes renal function okay, temperature 99.5, blood pressure 136/96, heart rate 66, RR 24, chest x-ray showed atelectasis versus infiltration at the base with cardiomegaly.  CT of C-spine is negative for bony fracture. Pt is placed on MedSurg bed for comfort care.  Neurosurgeon, Dr. Izora Ribas is consulted.  CT-head: 1. Large lobar hemorrhage left parietal lobe. Hematoma volume 86 mL. This pattern is most typical for cerebral amyloid or hypertension. Patient found on floor. This hemorrhage is not typical for trauma. 2. Moderately large posterior scalp hematoma. 3. These results were called by telephone at the time of interpretation on 04/16/2020 at 11:13 am to provider Lavonia Drafts , who verbally acknowledged these results   Review of Systems: Could not reviewed since patient is in coma  Allergy: No Known Allergies  Past Medical History:  Diagnosis Date  . Hyperlipidemia   . Hypertension   .  Thyroid disease    hypothyroidism    No past surgical history on file.  Social History:  reports that she has never smoked. She has never used smokeless tobacco. She reports that she does not drink alcohol and does not use drugs.  Family History: No family history on file.  Could not be reviewed since patient is in coma.  Prior to Admission medications   Medication Sig Start Date End Date Taking? Authorizing Provider  levothyroxine (SYNTHROID, LEVOTHROID) 88 MCG tablet Take 1 tablet by mouth daily. 06/20/17   [provider]  lovastatin (MEVACOR) 20 MG tablet Take 1 tablet by mouth daily. 05/18/17   [provider]  triamterene-hydrochlorothiazide (MAXZIDE-25) 37.5-25 MG tablet TAKE 0.5 TABLETS BY MOUTH EVERY MORNING FOR BLOOD PRESSURE 05/24/17   [provider]    Physical Exam: Vitals:   04/16/20 1215 04/16/20 1230 04/16/20 1245 04/16/20 1538  BP: (!) 155/67 (!) 152/84 (!) 149/92 (!) 154/85  Pulse: 73 70 68 83  Resp: 16 16 16 18   Temp:      TempSrc:      SpO2: 99% 96% 99% 98%   General: Not in acute distress HEENT:       Eyes: PERRL, EOMI, no scleral icterus.       ENT: No discharge from the ears and nose      Neck: No JVD, no bruit, no mass felt. Heme: No neck lymph node enlargement. Cardiac: S1/S2, RRR, No murmurs, No gallops or rubs. Respiratory: Has rhonchi bilaterally GI: Soft, nondistended, nontender, no organomegaly, BS present. GU: No hematuria Ext: No  pitting leg edema bilaterally. 1+DP/PT pulse bilaterally. Musculoskeletal: No joint deformities, No joint redness or warmth, no limitation of ROM in spin. Skin: No rashes.  Neuro: Patient is in coma, unresponsive to painful stimuli. Psych: Patient is not psychotic, no suicidal or hemocidal ideation.  Labs on Admission: I have personally reviewed following labs and imaging studies  CBC: Recent Labs  Lab 04/16/20 0955  WBC 13.4*  NEUTROABS 11.6*  HGB 10.4*  HCT 33.2*  MCV 92.0  PLT  161   Basic Metabolic Panel: Recent Labs  Lab 04/16/20 0955  NA 137  K 3.8  CL 101  CO2 23  GLUCOSE 143*  BUN 15  CREATININE 0.73  CALCIUM 7.9*   GFR: CrCl cannot be calculated (Unknown ideal weight.). Liver Function Tests: Recent Labs  Lab 04/16/20 0955  AST 37  ALT 16  ALKPHOS 60  BILITOT 0.9  PROT 6.4*  ALBUMIN 3.4*   No results for input(s): LIPASE, AMYLASE in the last 168 hours. No results for input(s): AMMONIA in the last 168 hours. Coagulation Profile: No results for input(s): INR, PROTIME in the last 168 hours. Cardiac Enzymes: No results for input(s): CKTOTAL, CKMB, CKMBINDEX, TROPONINI in the last 168 hours. BNP (last 3 results) No results for input(s): PROBNP in the last 8760 hours. HbA1C: No results for input(s): HGBA1C in the last 72 hours. CBG: No results for input(s): GLUCAP in the last 168 hours. Lipid Profile: No results for input(s): CHOL, HDL, LDLCALC, TRIG, CHOLHDL, LDLDIRECT in the last 72 hours. Thyroid Function Tests: No results for input(s): TSH, T4TOTAL, FREET4, T3FREE, THYROIDAB in the last 72 hours. Anemia Panel: No results for input(s): VITAMINB12, FOLATE, FERRITIN, TIBC, IRON, RETICCTPCT in the last 72 hours. Urine analysis:    Component Value Date/Time   COLORURINE YELLOW (A) 04/16/2020 0951   APPEARANCEUR HAZY (A) 04/16/2020 0951   APPEARANCEUR Cloudy 10/03/2014 1500   LABSPEC 1.020 04/16/2020 0951   LABSPEC 1.012 10/03/2014 1500   PHURINE 5.0 04/16/2020 0951   GLUCOSEU 50 (A) 04/16/2020 0951   GLUCOSEU Negative 10/03/2014 1500   HGBUR NEGATIVE 04/16/2020 0951   BILIRUBINUR NEGATIVE 04/16/2020 0951   BILIRUBINUR Negative 10/03/2014 1500   KETONESUR NEGATIVE 04/16/2020 0951   PROTEINUR 30 (A) 04/16/2020 0951   NITRITE NEGATIVE 04/16/2020 0951   LEUKOCYTESUR NEGATIVE 04/16/2020 0951   LEUKOCYTESUR 1+ 10/03/2014 1500   Sepsis Labs: @LABRCNTIP (procalcitonin:4,lacticidven:4) ) Recent Results (from the past 240 hour(s))    Respiratory Panel by RT PCR (Flu A&B, Covid) - Nasopharyngeal Swab     Status: None   Collection Time: 04/16/20  9:55 AM   Specimen: Nasopharyngeal Swab  Result Value Ref Range Status   SARS Coronavirus 2 by RT PCR NEGATIVE NEGATIVE Final    Comment: (NOTE) SARS-CoV-2 target nucleic acids are NOT DETECTED.  The SARS-CoV-2 RNA is generally detectable in upper respiratoy specimens during the acute phase of infection. The lowest concentration of SARS-CoV-2 viral copies this assay can detect is 131 copies/mL. A negative result does not preclude SARS-Cov-2 infection and should not be used as the sole basis for treatment or other patient management decisions. A negative result may occur with  improper specimen collection/handling, submission of specimen other than nasopharyngeal swab, presence of viral mutation(s) within the areas targeted by this assay, and inadequate number of viral copies (<131 copies/mL). A negative result must be combined with clinical observations, patient history, and epidemiological information. The expected result is Negative.  Fact Sheet for Patients:  PinkCheek.be  Fact Sheet for Healthcare  Providers:  GravelBags.it  This test is no t yet approved or cleared by the [place] and  has been authorized for detection and/or diagnosis of SARS-CoV-2 by FDA under an Emergency Use Authorization (EUA). This EUA will remain  in effect (meaning this test can be used) for the duration of the COVID-19 declaration under Section 564(b)(1) of the Act, 21 U.S.C. section 360bbb-3(b)(1), unless the authorization is terminated or revoked sooner.     Influenza A by PCR NEGATIVE NEGATIVE Final   Influenza B by PCR NEGATIVE NEGATIVE Final    Comment: (NOTE) The Xpert Xpress SARS-CoV-2/FLU/RSV assay is intended as an aid in  the diagnosis of influenza from Nasopharyngeal swab specimens and  should not be used as  a sole basis for treatment. Nasal washings and  aspirates are unacceptable for Xpert Xpress SARS-CoV-2/FLU/RSV  testing.  Fact Sheet for Patients: PinkCheek.be  Fact Sheet for Healthcare Providers: GravelBags.it  This test is not yet approved or cleared by the [place] FDA and  has been authorized for detection and/or diagnosis of SARS-CoV-2 by  FDA under an Emergency Use Authorization (EUA). This EUA will remain  in effect (meaning this test can be used) for the duration of the  Covid-19 declaration under Section 564(b)(1) of the Act, 21  U.S.C. section 360bbb-3(b)(1), unless the authorization is  terminated or revoked. Performed at [facility], [address] [place], [place] [address]      Radiological Exams on Admission: CT Head Wo Contrast  Result Date: 04/16/2020 CLINICAL DATA:  Mental status change, unknown cause EXAM: CT HEAD WITHOUT CONTRAST TECHNIQUE: Multidetector CT imaging of the head was performed following the standard protocol without intravenous contrast. Multiplanar CT image reconstructions were also generated. COMPARISON:  CT head 08/09/2017. FINDINGS: CT HEAD FINDINGS Brain: Large lobar hemorrhage in the left parietal lobe measuring approximately 6.9 x 5.0 x 5.0 cm. Estimated hematoma volume 86 mL. There is surrounding edema and mass-effect. 8 mm midline shift to the right. There is dilatation of the left temporal horn which appears trapped due to hematoma. No subarachnoid or intraventricular hemorrhage. Generalized atrophy. Chronic microvascular ischemic changes in the white matter. Vascular: Negative for hyperdense vessel Skull: Negative for skull fracture. Sinuses/Orbits: Paranasal sinuses clear. Bilateral cataract extraction. Other: Moderately large posterior scalp hematoma, not present on the prior study. IMPRESSION: 1. Large lobar hemorrhage left parietal lobe. Hematoma volume 86 mL. This  pattern is most typical for cerebral amyloid or hypertension. Patient found on floor. This hemorrhage is not typical for trauma. 2. Moderately large posterior scalp hematoma. 3. These results were called by telephone at the time of interpretation on 04/16/2020 at 11:13 am to provider Lavonia Drafts , who verbally acknowledged these results. Electronically Signed   By: Franchot Gallo M.D.   On: 04/16/2020 11:14   CT CERVICAL SPINE WO CONTRAST  Result Date: 04/16/2020 CLINICAL DATA:  Mental status change. Found unresponsive on the floor EXAM: CT CERVICAL SPINE WITHOUT CONTRAST TECHNIQUE: Multidetector CT imaging of the cervical spine was performed without intravenous contrast. Multiplanar CT image reconstructions were also generated. COMPARISON:  None FINDINGS: Alignment: The alignment of the cervical spine appears normal. Skull base and vertebrae: The vertebral body heights are well preserved. No acute fracture or dislocation. Soft tissues and spinal canal: No prevertebral fluid or swelling. No visible canal hematoma. Disc levels: Multilevel disc space narrowing and endplate spurring is most advanced at C4-5. The facet joints are all well aligned. Mild left-sided facet arthropathy noted at C5-6  and C6-7. Upper chest: Negative. Other: None. IMPRESSION: 1. No evidence for cervical spine fracture. 2. Cervical degenerative disc disease. Electronically Signed   By: Kerby Moors M.D.   On: 04/16/2020 11:09   DG Chest Port 1 View  Result Date: 04/16/2020 CLINICAL DATA:  Possible sepsis EXAM: PORTABLE CHEST 1 VIEW COMPARISON:  None available FINDINGS: Cardiomegaly. Prominent main pulmonary artery size. Low volume chest with interstitial coarsening. Streaky density at the lung bases. No visible effusion or pneumothorax. IMPRESSION: 1. Atelectasis or infiltrates at the bases. 2. Cardiomegaly and pulmonary artery enlargement. Electronically Signed   By: Monte Fantasia M.D.   On: 04/16/2020 10:20     EKG: Personally  reviewed, sinus rhythm, QTC 455, T wave inversion in V4-V6, poor quality of EKG strips  Assessment/Plan Principal Problem:   Comfort measures only status Active Problems:   Coma ([place])   ICH (intracerebral hemorrhage) (HCC)   SIRS (systemic inflammatory response syndrome) (HCC)   Lactic acidosis   HLD (hyperlipidemia)   HTN (hypertension)   Normocytic anemia   Hypothyroidism   Comfort measures only status: Patient has multiple comorbidities, now presents with coma due to [place]. CT of head showed l pariental lobe intraparenchymal hemorrhage with substantial mass effect and midline shift, and a trapped left temporal horn.  Neurosurgeon, Dr. Izora Ribas is consulted.  Her mortality rate is 97% per Dr. Izora Ribas. He recommended against surgery. He also recommended comfort measures. pt's prognosis is extremely poor. I have had extensive discussion with her daughter. Patient's daughter is very supportive. She agreed for comfort care now, which is reasonable. Pt will be DNR.  -Will place in bed for comfort care obs -Stop drawing labs and IVF -prn haldol for agitation -morphine prn IV for pain -Psycho/Social: emotional support offered to patient and family at bedside -consult to palliterative care team in AM  Other active issues as listed below: will stop treatment and start comfort care:    Coma due to [place]     SIRS (systemic inflammatory response syndrome) (HCC)    Lactic acidosis    HLD (hyperlipidemia)    HTN (hypertension)    Normocytic anemia    Hypothyroidism   DVT ppx: none Code Status: DNR Family Communication:    Yes, patient's  daughter at bed side Disposition Plan:  To be determined Consults called:  Dr.Yarbrouhg of neurosugery Admission status: Med-surg bed for obs  Status is: Observation  The patient remains OBS appropriate and will d/c before 2 midnights.  Dispo: The patient is from: Home              Anticipated d/c is to: to be determined               Anticipated d/c date is: 1 day              Patient currently is not medically stable to d/c.          Date of Service 04/16/2020    Ivor Costa [facility]   If 7PM-7AM, please contact night-coverage [email or web address] 04/16/2020, 4:58 PM

## 2020-04-16 NOTE — ED Notes (Addendum)
Oxygen sats 85%. Pt placed on 3L [place] with improvement to 95%

## 2020-04-16 NOTE — ED Provider Notes (Signed)
[facility] Emergency Department Provider Note   ____________________________________________    I have reviewed the triage vital signs and the nursing notes.   HISTORY  Chief Complaint Altered mental status  History limited by altered mental status  HPI Carolyn Fields is a 86 y.o. female who presents with altered mental status.  EMS reports patient was found down this morning by caregiver, has been unresponsive since.  Apparently lives alone and has caregiver in the morning.  No further history available at this time  Past Medical History:  Diagnosis Date   Hyperlipidemia    Hypertension    Thyroid disease    hypothyroidism    There are no problems to display for this patient.   No past surgical history on file.  Prior to Admission medications   Medication Sig Start Date End Date Taking? Authorizing Provider  levothyroxine (SYNTHROID, LEVOTHROID) 88 MCG tablet Take 1 tablet by mouth daily. 06/20/17   [provider]  lovastatin (MEVACOR) 20 MG tablet Take 1 tablet by mouth daily. 05/18/17   [provider]  triamterene-hydrochlorothiazide (MAXZIDE-25) 37.5-25 MG tablet TAKE 0.5 TABLETS BY MOUTH EVERY MORNING FOR BLOOD PRESSURE 05/24/17   [provider]     Allergies Patient has no known allergies.  No family history on file.  Social History Social History   Tobacco Use   Smoking status: Never Smoker   Smokeless tobacco: Never Used  Substance Use Topics   Alcohol use: No   Drug use: No    Unable to obtain review of Systems     ____________________________________________   PHYSICAL EXAM:  VITAL SIGNS: ED Triage Vitals  Enc Vitals Group     BP 04/16/20 0952 (!) 153/72     Pulse Rate 04/16/20 0949 78     Resp 04/16/20 0949 17     Temp 04/16/20 1000 99.5 F (37.5 C)     Temp Source 04/16/20 1000 Rectal     SpO2 04/16/20 0949 100 %     Weight --      Height --      Head  Circumference --      Peak Flow --      Pain Score 04/16/20 0949 Asleep     Pain Loc --      Pain Edu? --      Excl. in [place]? --     Constitutional: Unresponsive, withdraws from pain Eyes: Conjunctivae are normal.  Head: Atraumatic. Nose: No swelling or epistaxis Mouth/Throat: Mucous membranes are dry Neck: No vertebral tenderness palpation, no pain with axial load Cardiovascular: Normal rate, regular rhythm. Grossly normal heart sounds.  Good peripheral circulation. Respiratory: Normal respiratory effort.  No retractions. Lungs CTAB. Gastrointestinal: Soft and nontender. No distention.  No CVA tenderness.  Musculoskeletal: No lower extremity tenderness nor edema.  Warm and well perfused Neurologic: GCS of 4, protecting airway Skin:  Skin is warm, dry and intact. No rash noted. Psychiatric: Unable to examine  ____________________________________________   LABS (all labs ordered are listed, but only abnormal results are displayed)  Labs Reviewed  LACTIC ACID, PLASMA - Abnormal; Notable for the following components:      Result Value   Lactic Acid, Venous 3.9 (*)    All other components within normal limits  COMPREHENSIVE METABOLIC PANEL - Abnormal; Notable for the following components:   Glucose, Bld 143 (*)    Calcium 7.9 (*)    Total Protein 6.4 (*)    Albumin 3.4 (*)  All other components within normal limits  CBC WITH DIFFERENTIAL/PLATELET - Abnormal; Notable for the following components:   WBC 13.4 (*)    RBC 3.61 (*)    Hemoglobin 10.4 (*)    HCT 33.2 (*)    Neutro Abs 11.6 (*)    Lymphs Abs 0.4 (*)    Monocytes Absolute 1.3 (*)    All other components within normal limits  URINALYSIS, COMPLETE (UACMP) WITH MICROSCOPIC - Abnormal; Notable for the following components:   Color, Urine YELLOW (*)    APPearance HAZY (*)    Glucose, UA 50 (*)    Protein, ur 30 (*)    All other components within normal limits  RESPIRATORY PANEL BY RT PCR (FLU A&B, COVID)    CULTURE, BLOOD (SINGLE)  URINE CULTURE  LACTIC ACID, PLASMA  BLOOD GAS, VENOUS  PROTIME-INR  APTT   ____________________________________________  EKG  ED ECG REPORT I, Lavonia Drafts, the attending physician, personally viewed and interpreted this ECG.  Date: 04/16/2020  Rhythm: normal sinus rhythm QRS Axis: normal Intervals: normal ST/T Wave abnormalities: normal Narrative Interpretation: no evidence of acute ischemia  ____________________________________________  RADIOLOGY  Chest x-ray viewed by me, questionable pneumonia bilaterally  CT head reviewed by me, large hemorrhage noted, spoke with Dr. Carlis Abbott of radiology about this ____________________________________________   PROCEDURES  Procedure(s) performed: No  .1-3 Lead EKG Interpretation Performed by: Lavonia Drafts, MD Authorized by: Lavonia Drafts, MD     Interpretation: normal     ECG rate assessment: normal     Rhythm: sinus rhythm     Ectopy: none     Conduction: normal       Critical Care performed: yes  CRITICAL CARE Performed by: Lavonia Drafts   Total critical care time:35 minutes  Critical care time was exclusive of separately billable procedures and treating other patients.  Critical care was necessary to treat or prevent imminent or life-threatening deterioration.  Critical care was time spent personally by me on the following activities: development of treatment plan with patient and/or surrogate as well as nursing, discussions with consultants, evaluation of patient's response to treatment, examination of patient, obtaining history from patient or surrogate, ordering and performing treatments and interventions, ordering and review of laboratory studies, ordering and review of radiographic studies, pulse oximetry and re-evaluation of patient's condition.  ____________________________________________   INITIAL IMPRESSION / ASSESSMENT AND PLAN / ED COURSE  Pertinent labs & imaging  results that were available during my care of the patient were reviewed by me and considered in my medical decision making (see chart for details).  Patient presents unresponsive as detailed below.  Differential is extensive, including possible head trauma from fall, electrolyte abnormality, sepsis, etc.  We'll obtain broad work-up, including CT head, chest x-ray, urinalysis.  ----------------------------------------- 10:29 AM on 04/16/2020 -----------------------------------------  Patient's daughter is here now, reports patient at baseline is able to dress herself and is "fairly lively ".  Patient is not a DNR.  Chest x-ray with questionable infiltrates versus atelectasis, mild elevation of white blood cell count, afebrile.  Pending additional labs.  Reviewed CT head, demonstrates large ICH, discussed with Dr. Carlis Abbott of radiology.  Discussed with Dr. Cari Caraway of neurosurgery who notes patient has 97% mortality based on imaging and recommends comfort care.  Discussed this with patient's daughter as well as her granddaughter as well as patient's son  They agree with comfort care.  Will consult the hospitalist for admission  DNR order placed, comfort care orders placed  ____________________________________________   FINAL CLINICAL IMPRESSION(S) / ED DIAGNOSES  Final diagnoses:  Intracranial hematoma with loss of consciousness, initial encounter [facility])        Note:  This document was prepared using Dragon voice recognition software and may include unintentional dictation errors.   Lavonia Drafts, MD 04/16/20 1242

## 2020-04-16 NOTE — ED Notes (Signed)
Lab called to collect missing blood work

## 2020-04-17 DIAGNOSIS — Z7189 Other specified counseling: Secondary | ICD-10-CM | POA: Diagnosis not present

## 2020-04-17 DIAGNOSIS — I10 Essential (primary) hypertension: Secondary | ICD-10-CM | POA: Diagnosis present

## 2020-04-17 DIAGNOSIS — D1802 Hemangioma of intracranial structures: Secondary | ICD-10-CM | POA: Diagnosis present

## 2020-04-17 DIAGNOSIS — Z9841 Cataract extraction status, right eye: Secondary | ICD-10-CM | POA: Diagnosis not present

## 2020-04-17 DIAGNOSIS — S06369A Traumatic hemorrhage of cerebrum, unspecified, with loss of consciousness of unspecified duration, initial encounter: Secondary | ICD-10-CM | POA: Diagnosis not present

## 2020-04-17 DIAGNOSIS — E785 Hyperlipidemia, unspecified: Secondary | ICD-10-CM | POA: Diagnosis present

## 2020-04-17 DIAGNOSIS — Z9842 Cataract extraction status, left eye: Secondary | ICD-10-CM | POA: Diagnosis not present

## 2020-04-17 DIAGNOSIS — I611 Nontraumatic intracerebral hemorrhage in hemisphere, cortical: Secondary | ICD-10-CM | POA: Diagnosis present

## 2020-04-17 DIAGNOSIS — Z515 Encounter for palliative care: Secondary | ICD-10-CM | POA: Diagnosis not present

## 2020-04-17 DIAGNOSIS — R6510 Systemic inflammatory response syndrome (SIRS) of non-infectious origin without acute organ dysfunction: Secondary | ICD-10-CM | POA: Diagnosis present

## 2020-04-17 DIAGNOSIS — R4020 Unspecified coma: Secondary | ICD-10-CM | POA: Diagnosis present

## 2020-04-17 DIAGNOSIS — G936 Cerebral edema: Secondary | ICD-10-CM

## 2020-04-17 DIAGNOSIS — S0003XA Contusion of scalp, initial encounter: Secondary | ICD-10-CM | POA: Diagnosis present

## 2020-04-17 DIAGNOSIS — E039 Hypothyroidism, unspecified: Secondary | ICD-10-CM | POA: Diagnosis present

## 2020-04-17 DIAGNOSIS — I619 Nontraumatic intracerebral hemorrhage, unspecified: Secondary | ICD-10-CM | POA: Diagnosis present

## 2020-04-17 DIAGNOSIS — D649 Anemia, unspecified: Secondary | ICD-10-CM | POA: Diagnosis present

## 2020-04-17 DIAGNOSIS — Z20822 Contact with and (suspected) exposure to covid-19: Secondary | ICD-10-CM | POA: Diagnosis present

## 2020-04-17 DIAGNOSIS — Z79899 Other long term (current) drug therapy: Secondary | ICD-10-CM | POA: Diagnosis not present

## 2020-04-17 DIAGNOSIS — Z79890 Hormone replacement therapy (postmenopausal): Secondary | ICD-10-CM | POA: Diagnosis not present

## 2020-04-17 DIAGNOSIS — E872 Acidosis: Secondary | ICD-10-CM | POA: Diagnosis present

## 2020-04-17 DIAGNOSIS — Z66 Do not resuscitate: Secondary | ICD-10-CM | POA: Diagnosis present

## 2020-04-17 DIAGNOSIS — R40243 Glasgow coma scale score 3-8, unspecified time: Secondary | ICD-10-CM | POA: Diagnosis not present

## 2020-04-17 DIAGNOSIS — Y92009 Unspecified place in unspecified non-institutional (private) residence as the place of occurrence of the external cause: Secondary | ICD-10-CM | POA: Diagnosis not present

## 2020-04-17 MED ORDER — SCOPOLAMINE 1 MG/3DAYS TD PT72
1.0000 | MEDICATED_PATCH | TRANSDERMAL | Status: DC
  Administered 2020-04-17: 14:00:00 1.5 mg via TRANSDERMAL
  Filled 2020-04-17: qty 1

## 2020-04-17 MED ORDER — GLYCOPYRROLATE 1 MG PO TABS
1.0000 mg | ORAL_TABLET | ORAL | Status: DC | PRN
  Filled 2020-04-17: qty 1

## 2020-04-17 NOTE — Plan of Care (Signed)

## 2020-04-17 NOTE — Progress Notes (Signed)
Pt feels hot to the touch.  Family at bedside refused tylenol be given to patient.  Cool wash cloths placed on pt for comfort measures.  Family verbalized appreciation.

## 2020-04-17 NOTE — Progress Notes (Signed)
PROGRESS NOTE    Carolyn Fields  PQZ:[id number] DOB: 04-22-1923 DOA: 04/16/2020 PCP: Ezequiel Kayser, MD   Chief complaint altered mental status. Brief Narrative:  Carolyn Fields is a 86 y.o. female with medical history significant of hypertension, hyperlipidemia, hypothyroidism, who presents with unresponsiveness.  CT scan showed a large lobular hemorrhage in left parietal lobe.  Discussed with neurosurgery, no surgical option.  After discussion with the family, initiated comfort care.   Assessment & Plan:   Principal Problem:   Comfort measures only status Active Problems:   Coma ([place])   ICH (intracerebral hemorrhage) (HCC)   SIRS (systemic inflammatory response syndrome) (HCC)   Lactic acidosis   HLD (hyperlipidemia)   HTN (hypertension)   Normocytic anemia   Hypothyroidism   Intracranial hemangioma ([place])   Patient currently comfortable, she is not responsive.  Family will meet hospice this afternoon to decide to go with hospice at home versus inpatient hospice facility. Anticipating discharge tomorrow.     Subjective: Patient is unresponsive.  Does not seem to have any discomfort.  No short of breath or hypoxia.  No nausea vomiting.  No pain.  Objective: Vitals:   04/16/20 1855 04/16/20 2010 04/16/20 2022 04/17/20 0822  BP: (!) 155/78 (!) 164/78  133/71  Pulse: 87 87  88  Resp: 20 (!) 21  20  Temp:   (!) 102 F (38.9 C) 100.3 F (37.9 C)  TempSrc:   Axillary   SpO2: 97% 98%  97%   No intake or output data in the 24 hours ending 04/17/20 1417 There were no vitals filed for this visit.  Examination:  General exam: Appears calm and comfortable  Respiratory system: Clear to auscultation. Respiratory effort normal. Cardiovascular system: S1 & S2 heard, RRR. No JVD, murmurs, rubs, gallops or clicks. No pedal edema. Gastrointestinal system: Abdomen is nondistended, soft and nontender. No organomegaly or masses felt. Normal bowel sounds heard. Central nervous  system: Unresponsive Extremities: No edema     Data Reviewed: I have personally reviewed following labs and imaging studies  CBC: Recent Labs  Lab 04/16/20 0955  WBC 13.4*  NEUTROABS 11.6*  HGB 10.4*  HCT 33.2*  MCV 92.0  PLT 335   Basic Metabolic Panel: Recent Labs  Lab 04/16/20 0955  NA 137  K 3.8  CL 101  CO2 23  GLUCOSE 143*  BUN 15  CREATININE 0.73  CALCIUM 7.9*   GFR: CrCl cannot be calculated (Unknown ideal weight.). Liver Function Tests: Recent Labs  Lab 04/16/20 0955  AST 37  ALT 16  ALKPHOS 60  BILITOT 0.9  PROT 6.4*  ALBUMIN 3.4*   No results for input(s): LIPASE, AMYLASE in the last 168 hours. No results for input(s): AMMONIA in the last 168 hours. Coagulation Profile: No results for input(s): INR, PROTIME in the last 168 hours. Cardiac Enzymes: No results for input(s): CKTOTAL, CKMB, CKMBINDEX, TROPONINI in the last 168 hours. BNP (last 3 results) No results for input(s): PROBNP in the last 8760 hours. HbA1C: No results for input(s): HGBA1C in the last 72 hours. CBG: No results for input(s): GLUCAP in the last 168 hours. Lipid Profile: No results for input(s): CHOL, HDL, LDLCALC, TRIG, CHOLHDL, LDLDIRECT in the last 72 hours. Thyroid Function Tests: No results for input(s): TSH, T4TOTAL, FREET4, T3FREE, THYROIDAB in the last 72 hours. Anemia Panel: No results for input(s): VITAMINB12, FOLATE, FERRITIN, TIBC, IRON, RETICCTPCT in the last 72 hours. Sepsis Labs: Recent Labs  Lab 04/16/20 0951  LATICACIDVEN 3.9*  Recent Results (from the past 240 hour(s))  Respiratory Panel by RT PCR (Flu A&B, Covid) - Nasopharyngeal Swab     Status: None   Collection Time: 04/16/20  9:55 AM   Specimen: Nasopharyngeal Swab  Result Value Ref Range Status   SARS Coronavirus 2 by RT PCR NEGATIVE NEGATIVE Final    Comment: (NOTE) SARS-CoV-2 target nucleic acids are NOT DETECTED.  The SARS-CoV-2 RNA is generally detectable in upper  respiratoy specimens during the acute phase of infection. The lowest concentration of SARS-CoV-2 viral copies this assay can detect is 131 copies/mL. A negative result does not preclude SARS-Cov-2 infection and should not be used as the sole basis for treatment or other patient management decisions. A negative result may occur with  improper specimen collection/handling, submission of specimen other than nasopharyngeal swab, presence of viral mutation(s) within the areas targeted by this assay, and inadequate number of viral copies (<131 copies/mL). A negative result must be combined with clinical observations, patient history, and epidemiological information. The expected result is Negative.  Fact Sheet for Patients:  PinkCheek.be  Fact Sheet for Healthcare Providers:  GravelBags.it  This test is no t yet approved or cleared by the [place] FDA and  has been authorized for detection and/or diagnosis of SARS-CoV-2 by FDA under an Emergency Use Authorization (EUA). This EUA will remain  in effect (meaning this test can be used) for the duration of the COVID-19 declaration under Section 564(b)(1) of the Act, 21 U.S.C. section 360bbb-3(b)(1), unless the authorization is terminated or revoked sooner.     Influenza A by PCR NEGATIVE NEGATIVE Final   Influenza B by PCR NEGATIVE NEGATIVE Final    Comment: (NOTE) The Xpert Xpress SARS-CoV-2/FLU/RSV assay is intended as an aid in  the diagnosis of influenza from Nasopharyngeal swab specimens and  should not be used as a sole basis for treatment. Nasal washings and  aspirates are unacceptable for Xpert Xpress SARS-CoV-2/FLU/RSV  testing.  Fact Sheet for Patients: PinkCheek.be  Fact Sheet for Healthcare Providers: GravelBags.it  This test is not yet approved or cleared by the [place] FDA and  has been  authorized for detection and/or diagnosis of SARS-CoV-2 by  FDA under an Emergency Use Authorization (EUA). This EUA will remain  in effect (meaning this test can be used) for the duration of the  Covid-19 declaration under Section 564(b)(1) of the Act, 21  U.S.C. section 360bbb-3(b)(1), unless the authorization is  terminated or revoked. Performed at [facility], [address], [place], [place] [address]          Radiology Studies: CT Head Wo Contrast  Result Date: 04/16/2020 CLINICAL DATA:  Mental status change, unknown cause EXAM: CT HEAD WITHOUT CONTRAST TECHNIQUE: Multidetector CT imaging of the head was performed following the standard protocol without intravenous contrast. Multiplanar CT image reconstructions were also generated. COMPARISON:  CT head 08/09/2017. FINDINGS: CT HEAD FINDINGS Brain: Large lobar hemorrhage in the left parietal lobe measuring approximately 6.9 x 5.0 x 5.0 cm. Estimated hematoma volume 86 mL. There is surrounding edema and mass-effect. 8 mm midline shift to the right. There is dilatation of the left temporal horn which appears trapped due to hematoma. No subarachnoid or intraventricular hemorrhage. Generalized atrophy. Chronic microvascular ischemic changes in the white matter. Vascular: Negative for hyperdense vessel Skull: Negative for skull fracture. Sinuses/Orbits: Paranasal sinuses clear. Bilateral cataract extraction. Other: Moderately large posterior scalp hematoma, not present on the prior study. IMPRESSION: 1. Large lobar hemorrhage left parietal lobe.  Hematoma volume 86 mL. This pattern is most typical for cerebral amyloid or hypertension. Patient found on floor. This hemorrhage is not typical for trauma. 2. Moderately large posterior scalp hematoma. 3. These results were called by telephone at the time of interpretation on 04/16/2020 at 11:13 am to provider Lavonia Drafts , who verbally acknowledged these results. Electronically Signed   By:  Franchot Gallo M.D.   On: 04/16/2020 11:14   CT CERVICAL SPINE WO CONTRAST  Result Date: 04/16/2020 CLINICAL DATA:  Mental status change. Found unresponsive on the floor EXAM: CT CERVICAL SPINE WITHOUT CONTRAST TECHNIQUE: Multidetector CT imaging of the cervical spine was performed without intravenous contrast. Multiplanar CT image reconstructions were also generated. COMPARISON:  None FINDINGS: Alignment: The alignment of the cervical spine appears normal. Skull base and vertebrae: The vertebral body heights are well preserved. No acute fracture or dislocation. Soft tissues and spinal canal: No prevertebral fluid or swelling. No visible canal hematoma. Disc levels: Multilevel disc space narrowing and endplate spurring is most advanced at C4-5. The facet joints are all well aligned. Mild left-sided facet arthropathy noted at C5-6 and C6-7. Upper chest: Negative. Other: None. IMPRESSION: 1. No evidence for cervical spine fracture. 2. Cervical degenerative disc disease. Electronically Signed   By: Kerby Moors M.D.   On: 04/16/2020 11:09   DG Chest Port 1 View  Result Date: 04/16/2020 CLINICAL DATA:  Possible sepsis EXAM: PORTABLE CHEST 1 VIEW COMPARISON:  None available FINDINGS: Cardiomegaly. Prominent main pulmonary artery size. Low volume chest with interstitial coarsening. Streaky density at the lung bases. No visible effusion or pneumothorax. IMPRESSION: 1. Atelectasis or infiltrates at the bases. 2. Cardiomegaly and pulmonary artery enlargement. Electronically Signed   By: Monte Fantasia M.D.   On: 04/16/2020 10:20        Scheduled Meds: . glycopyrrolate  0.4 mg Intravenous Q4H  . mouth rinse  15 mL Mouth Rinse Q4H  . scopolamine  1 patch Transdermal Q72H   Continuous Infusions:   LOS: 0 days    Time spent: 28 minutes    Sharen Hones, MD [facility]   To contact the attending provider between 7A-7P or the covering provider during after hours 7P-7A, please log into the  web site [email or web address] and access using universal [place] password for that web site. If you do not have the password, please call the hospital operator.  04/17/2020, 2:17 PM

## 2020-04-17 NOTE — Progress Notes (Signed)
[facility] hospital liaison note:  New referral received for family interest in [facility]. Patient information sent to referral.Hospice eligibility pending. Writer spoke via telephone tp patient's daughter Carolyn Fields, who reports the family is undecided about whether to take her home or transfer to the hospice home.  [name] and her sister will be present at the hospital later this afternoon and will plan to meet with writer at that time. TOC [name] updated.  Flo Shanks BSN, RN, [place] [phone]

## 2020-04-17 NOTE — Progress Notes (Signed)
[facility] hospital Liaison note:  Follow up visit made to new referral for [facility] hospice services. Multiple conversations held with family through out the day. Patient's son Eduard Clos, and daughter's Stanton Kidney and Hoyle Sauer present. Education provided regarding hospice services at home and the hospice home, including the current hospice home visitation policy. Questions answered, understanding voiced. Family has chosen to focus on comfort and symptom management a the hospice home. Plan is for family to sign consents tomorrow at 1 pm. The hospice home staff will contact the weekend [facility] regarding transfer time/arrangements.  Patient will require non-emergent transportation with signed out of facility DNR in place. Thank you for the opportunity to be involved in the care of this patient and her family.  Flo Shanks BSN, RN, [place] [facility] [phone]

## 2020-04-17 NOTE — Progress Notes (Addendum)
Daily Progress Note   Patient Name: Carolyn Fields       Date: 04/17/2020 DOB: 08/12/1922  Age: 86 y.o. MRN#: [id number] Attending Physician: Sharen Hones, MD Primary Care Physician: Ezequiel Kayser, MD Admit Date: 04/16/2020  Reason for Consultation/Follow-up: Non pain symptom management  Subjective: Patient is resting in bed comfortably. Robinul administered regularly. Scopolamine patch initiated. Robinul to PRN.   Length of Stay: 0  Current Medications: Scheduled Meds:  . glycopyrrolate  0.4 mg Intravenous Q4H  . mouth rinse  15 mL Mouth Rinse Q4H  . scopolamine  1 patch Transdermal Q72H    Continuous Infusions:   PRN Meds: acetaminophen, antiseptic oral rinse, glycopyrrolate, haloperidol **OR** haloperidol **OR** haloperidol lactate, LORazepam, morphine injection, ondansetron **OR** ondansetron (ZOFRAN) IV, polyvinyl alcohol  Physical Exam Constitutional:      Comments: Eyes closed.   Pulmonary:     Effort: Pulmonary effort is normal.  Skin:    Comments: Feet warm to the touch.              Vital Signs: BP 133/71 (BP Location: Right Arm)   Pulse 88   Temp 100.3 F (37.9 C)   Resp 20   SpO2 97%  SpO2: SpO2: 97 % O2 Device: O2 Device: Nasal Cannula O2 Flow Rate:    Intake/output summary: No intake or output data in the 24 hours ending 04/17/20 1138 LBM:   Baseline Weight:   Most recent weight:         Palliative Assessment/Data:      Patient Active Problem List   Diagnosis Date Noted  . Intracranial hemangioma ([place]) 04/17/2020  . Coma ([place]) 04/16/2020  . ICH (intracerebral hemorrhage) ([place]) 04/16/2020  . SIRS (systemic inflammatory response syndrome) ([place]) 04/16/2020  . Lactic acidosis 04/16/2020  . HLD (hyperlipidemia) 04/16/2020  . HTN  (hypertension) 04/16/2020  . Normocytic anemia 04/16/2020  . Hypothyroidism 04/16/2020  . Comfort measures only status 04/16/2020  . Intracranial hematoma [facility])     Palliative Care Assessment & Plan   Recommendations/Plan:  Hospice facility vs hospice at home.     Code Status:    Code Status Orders  (From admission, onward)         Start     Ordered   04/16/20 1719  Do not attempt resuscitation (DNR)  Continuous  Question Answer Comment  In the event of cardiac or respiratory ARREST Do not call a "code blue"   In the event of cardiac or respiratory ARREST Do not perform Intubation, CPR, defibrillation or ACLS   In the event of cardiac or respiratory ARREST Use medication by any route, position, wound care, and other measures to relive pain and suffering. May use oxygen, suction and manual treatment of airway obstruction as needed for comfort.      04/16/20 1718        Code Status History    Date Active Date Inactive Code Status Order ID Comments User Context   04/16/2020 1235 04/16/2020 1718 DNR [id number]  Lavonia Drafts, MD ED   Advance Care Planning Activity       Prognosis:   Hours - Days    Care plan was discussed with hospice liason.  Thank you for allowing the Palliative Medicine Team to assist in the care of this patient.   Total Time 15 min Prolonged Time Billed  no      Greater than 50%  of this time was spent counseling and coordinating care related to the above assessment and plan.  Asencion Gowda, NP  Please contact Palliative Medicine Team phone at [phone] for questions and concerns.

## 2020-04-18 DIAGNOSIS — G936 Cerebral edema: Secondary | ICD-10-CM

## 2020-04-18 MED ORDER — HALOPERIDOL LACTATE 5 MG/ML IJ SOLN: 0.5000 mg | INTRAMUSCULAR | Status: AC | PRN

## 2020-04-18 MED ORDER — ONDANSETRON HCL 4 MG/2ML IJ SOLN: 4.0000 mg | Freq: Four times a day (QID) | INTRAMUSCULAR | 0 refills | Status: AC | PRN

## 2020-04-18 MED ORDER — ACETAMINOPHEN 650 MG RE SUPP: 650.0000 mg | RECTAL | 0 refills | Status: AC | PRN

## 2020-04-18 MED ORDER — LORAZEPAM 2 MG/ML IJ SOLN
1.0000 mg | INTRAMUSCULAR | 0 refills | Status: AC | PRN
Start: 2020-04-18 — End: ?

## 2020-04-18 MED ORDER — MORPHINE SULFATE (PF) 2 MG/ML IV SOLN
2.0000 mg | INTRAVENOUS | 0 refills | Status: AC | PRN
Start: 2020-04-18 — End: ?

## 2020-04-18 MED ORDER — SCOPOLAMINE 1 MG/3DAYS TD PT72: 1.0000 | MEDICATED_PATCH | TRANSDERMAL | 12 refills | Status: AC

## 2020-04-18 MED ORDER — HALOPERIDOL 0.5 MG PO TABS: 0.5000 mg | ORAL_TABLET | ORAL | Status: AC | PRN

## 2020-04-18 MED ORDER — GLYCOPYRROLATE 0.2 MG/ML IJ SOLN: 0.4000 mg | INTRAMUSCULAR | Status: AC

## 2020-04-18 MED ORDER — HALOPERIDOL LACTATE 2 MG/ML PO CONC: 0.5000 mg | ORAL | 0 refills | Status: AC | PRN

## 2020-04-18 NOTE — Progress Notes (Signed)
EMS arrived to transport pt to [facility]. Family at bedside, emotional support provided

## 2020-04-18 NOTE — Discharge Summary (Signed)
Physician Discharge Summary  Patient ID: Carolyn Fields MRN: [id number] DOB/AGE: [birth year] 86 y.o.  Admit date: 04/16/2020 Discharge date: 04/18/2020  Admission Diagnoses:  Discharge Diagnoses:  Principal Problem:   Comfort measures only status Active Problems:   Coma ([place])   ICH (intracerebral hemorrhage) (HCC)   SIRS (systemic inflammatory response syndrome) (HCC)   Lactic acidosis   HLD (hyperlipidemia)   HTN (hypertension)   Normocytic anemia   Hypothyroidism   Intracranial hemangioma (HCC)   Cerebral edema [facility])   Discharged Condition: poor  Hospital Course:  Carolyn Fields a 86 y.o.femalewith medical history significant ofhypertension, hyperlipidemia, hypothyroidism, who presents with unresponsiveness.  CT scan showed a large lobular hemorrhage in left parietal lobe.  Discussed with neurosurgery, no surgical option.  After discussion with the family, initiated comfort care. Family has decided to go to inpatient hospice.  Will be transferred today.   Consults:  Neurosurgery, palliative care and hospice  Significant Diagnostic Studies:  CT HEAD WITHOUT CONTRAST  TECHNIQUE: Multidetector CT imaging of the head was performed following the standard protocol without intravenous contrast. Multiplanar CT image reconstructions were also generated.  COMPARISON:  CT head 08/09/2017.  FINDINGS: CT HEAD FINDINGS  Brain: Large lobar hemorrhage in the left parietal lobe measuring approximately 6.9 x 5.0 x 5.0 cm. Estimated hematoma volume 86 mL. There is surrounding edema and mass-effect. 8 mm midline shift to the right. There is dilatation of the left temporal horn which appears trapped due to hematoma. No subarachnoid or intraventricular hemorrhage.  Generalized atrophy. Chronic microvascular ischemic changes in the white matter.  Vascular: Negative for hyperdense vessel  Skull: Negative for skull fracture.  Sinuses/Orbits: Paranasal sinuses  clear. Bilateral cataract extraction.  Other: Moderately large posterior scalp hematoma, not present on the prior study.  IMPRESSION: 1. Large lobar hemorrhage left parietal lobe. Hematoma volume 86 mL. This pattern is most typical for cerebral amyloid or hypertension. Patient found on floor. This hemorrhage is not typical for trauma. 2. Moderately large posterior scalp hematoma. 3. These results were called by telephone at the time of interpretation on 04/16/2020 at 11:13 am to provider Lavonia Drafts , who verbally acknowledged these results.   Electronically Signed   By: Franchot Gallo M.D.   On: 04/16/2020 11:14   Treatments: Comfort care measure.  Discharge Exam: Blood pressure (!) 142/88, pulse (!) 137, temperature 99 F (37.2 C), temperature source Oral, resp. rate 20, SpO2 100 %. General appearance: Unresponsive, ill-appearing. Resp: Shallow breathing, no crackles. Cardio: regular rate and rhythm, S1, S2 normal, no murmur, click, rub or gallop GI: soft, non-tender; bowel sounds normal; no masses,  no organomegaly Extremities: No edema.  Disposition: Discharge disposition: 51-Hospice/Medical Facility       Discharge Instructions    Diet - low sodium heart healthy   Complete by: As directed    Increase activity slowly   Complete by: As directed      Allergies as of 04/18/2020   No Known Allergies     Medication List    STOP taking these medications   levothyroxine 88 MCG tablet Commonly known as: SYNTHROID   lovastatin 20 MG tablet Commonly known as: MEVACOR   triamterene-hydrochlorothiazide 37.5-25 MG tablet Commonly known as: MAXZIDE-25     TAKE these medications   acetaminophen 650 MG suppository Commonly known as: TYLENOL Place 1 suppository (650 mg total) rectally every 4 (four) hours as needed for fever.   glycopyrrolate 0.2 MG/ML injection Commonly known as: ROBINUL Inject 2 mLs (0.4 mg  total) into the vein every 4 (four) hours.    haloperidol 0.5 MG tablet Commonly known as: HALDOL Take 1 tablet (0.5 mg total) by mouth every 4 (four) hours as needed for agitation (or delirium).   haloperidol 2 MG/ML solution Commonly known as: HALDOL Place 0.3 mLs (0.6 mg total) under the tongue every 4 (four) hours as needed for agitation (or delirium).   haloperidol lactate 5 MG/ML injection Commonly known as: HALDOL Inject 0.1 mLs (0.5 mg total) into the vein every 4 (four) hours as needed (or delirium).   LORazepam 2 MG/ML injection Commonly known as: ATIVAN Inject 0.5-1 mLs (1-2 mg total) into the vein every 2 (two) hours as needed for anxiety or seizure.   morphine 2 MG/ML injection Inject 1 mL (2 mg total) into the vein every 4 (four) hours as needed.   ondansetron 4 MG/2ML Soln injection Commonly known as: ZOFRAN Inject 2 mLs (4 mg total) into the vein every 6 (six) hours as needed for nausea.   scopolamine 1 MG/3DAYS Commonly known as: TRANSDERM-SCOP Place 1 patch (1.5 mg total) onto the skin every 3 (three) days. Start taking on: April 20, 2020        Signed: Sharen Hones 04/18/2020, 9:48 AM

## 2020-04-18 NOTE — Progress Notes (Signed)
CM notified me that patient will be transferring to hospice home at 2100 tonight

## 2020-04-18 NOTE — TOC Transition Note (Signed)
Transition of Care [facility]) - CM/SW Discharge Note   Patient Details  Name: Carolyn Fields MRN: [id number] Date of Birth: Apr 25, 1923  [facility] CM/SW Contact:  Harriet Masson, RN Phone Number:[phone] 04/18/2020, 5:12 PM   Clinical Narrative:    Damaris Schooner with [facility] Hospice facility who indicated pt will be transferred to the facility late tonight. [facility] has already spoke with the family and will be arranging transport. RN will completed the necessary paperwork for this transport. Bedside RN aware of these arrangements. RN faxed the discharge summary and alerted the floor of pt's discharge this evening. No other needs or request at this time.    Final next level of care: [place] Barriers to Discharge: No Barriers Identified   Patient Goals and CMS Choice        Discharge Placement                    Patient and family notified of of transfer:  (Hospice facility will notify the family)  Discharge Plan and Services                                     Social Determinants of Health (SDOH) Interventions     Readmission Risk Interventions No flowsheet data found.

## 2020-05-13 DEATH — deceased

## 2022-06-18 IMAGING — CT CT CERVICAL SPINE W/O CM
2 series · 13 of 27 positions shown, 16 images · non-contrast
Comparison: None

CLINICAL DATA: Mental status change. Found unresponsive on the
floor

EXAM:
CT CERVICAL SPINE WITHOUT CONTRAST
TECHNIQUE: Multidetector CT imaging of the cervical spine was performed without
intravenous contrast. Multiplanar CT image reconstructions were also
generated.

[Series 3: c spine soft · axial · 0.36mm/px · z∈[-182,-64]mm · 8 of 71 slices shown, 10 images]
[im 6/71  soft-tissue]
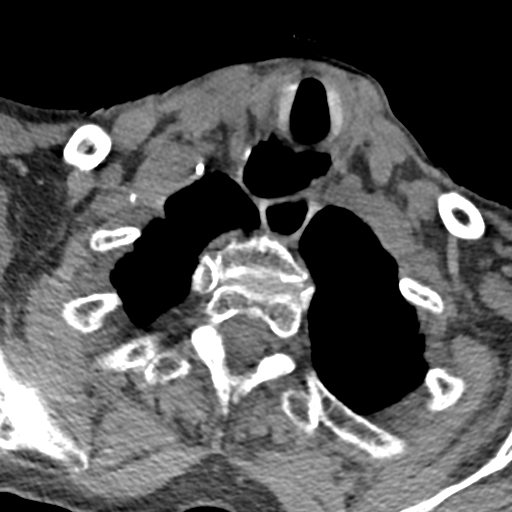
[im 6/71  bone]
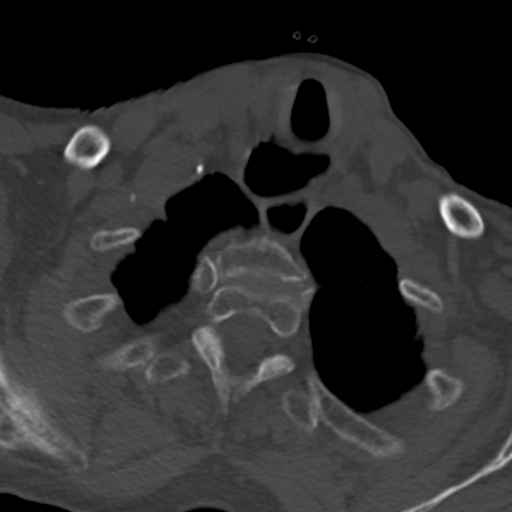
[im 17/71  bone]
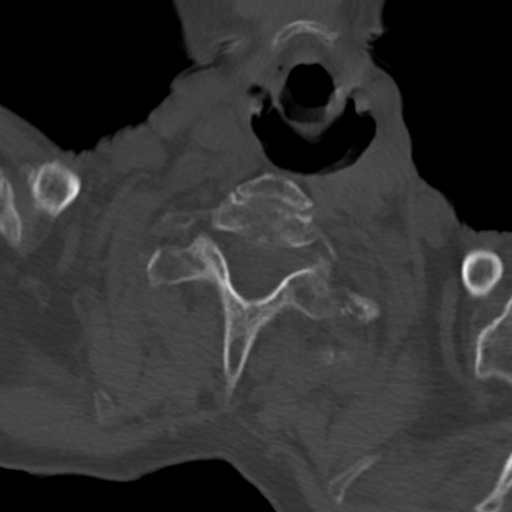
[im 22/71  bone]
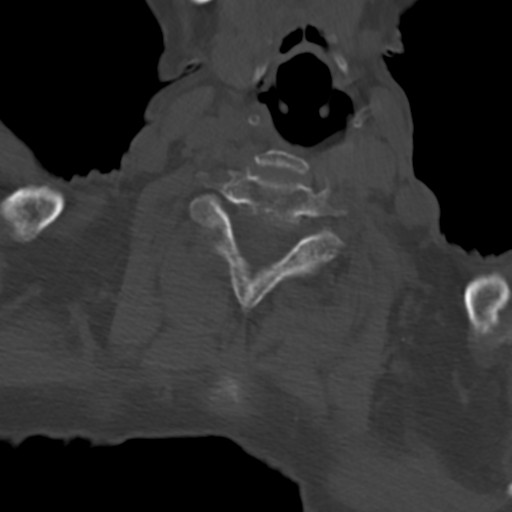
[im 33/71  bone]
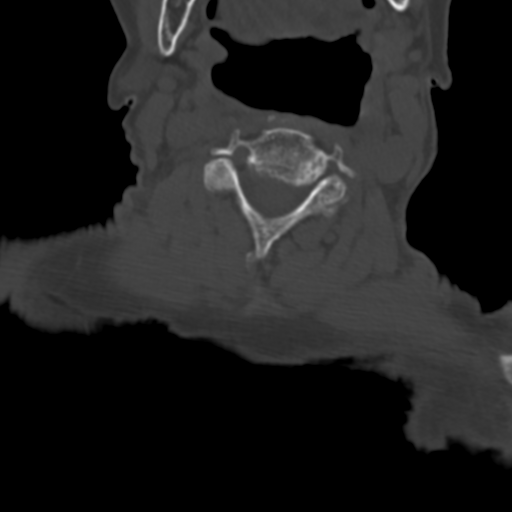
[im 38/71  soft-tissue]
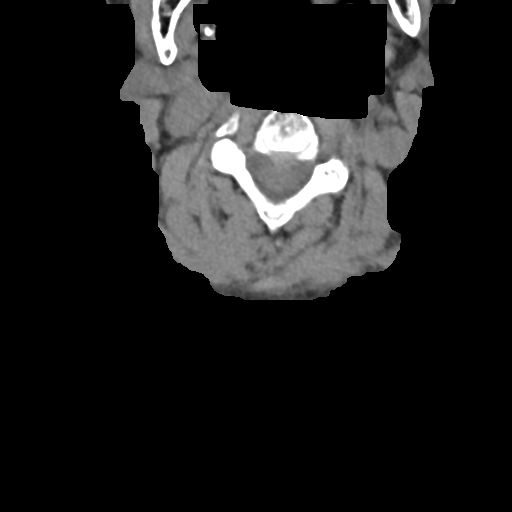
[im 38/71  bone]
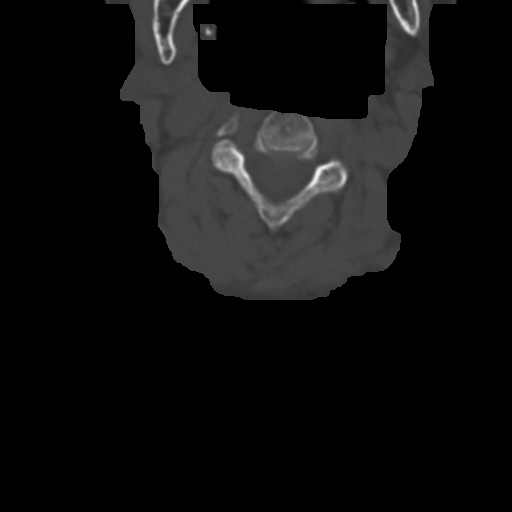
[im 49/71  bone]
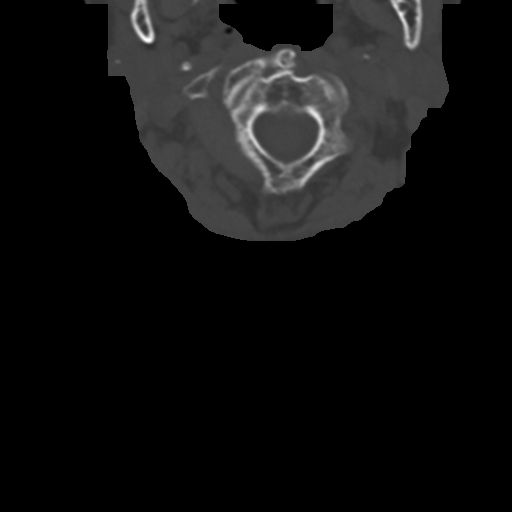
[im 54/71  bone]
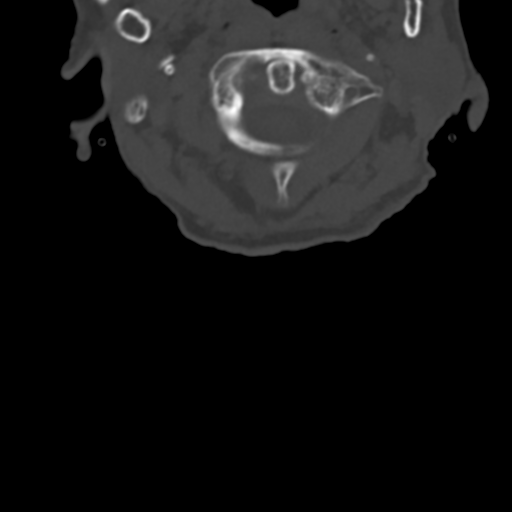
[im 65/71  bone]
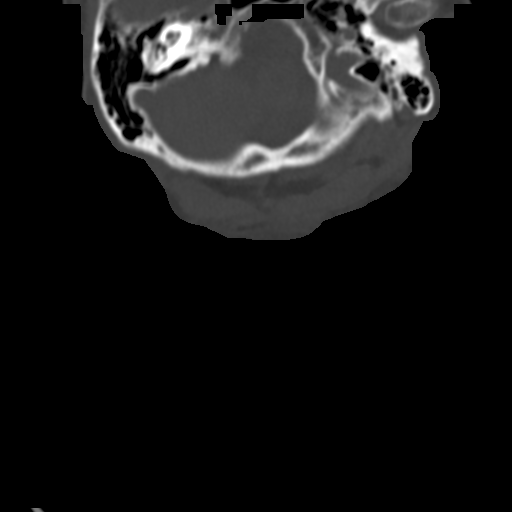

[Series 4: sagittal bone · sagittal · 0.27mm/px · 5 of 61 slices shown, 6 images]
[im 21/61  bone]
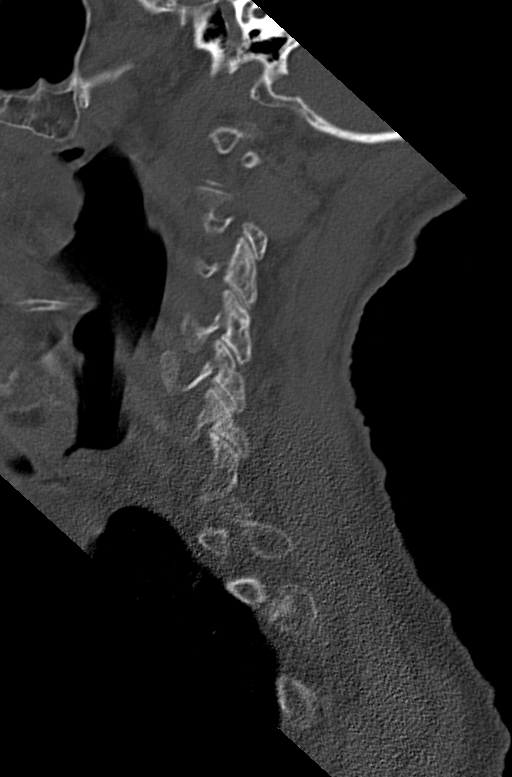
[im 26/61  bone]
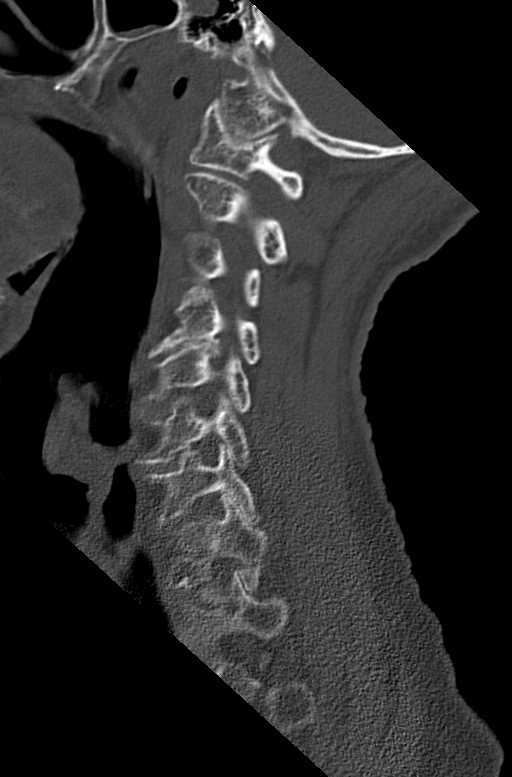
[im 31/61  soft-tissue]
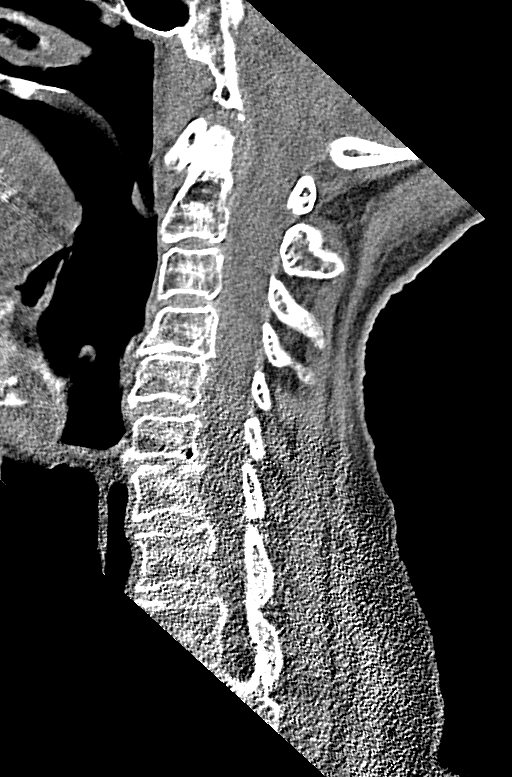
[im 31/61  bone]
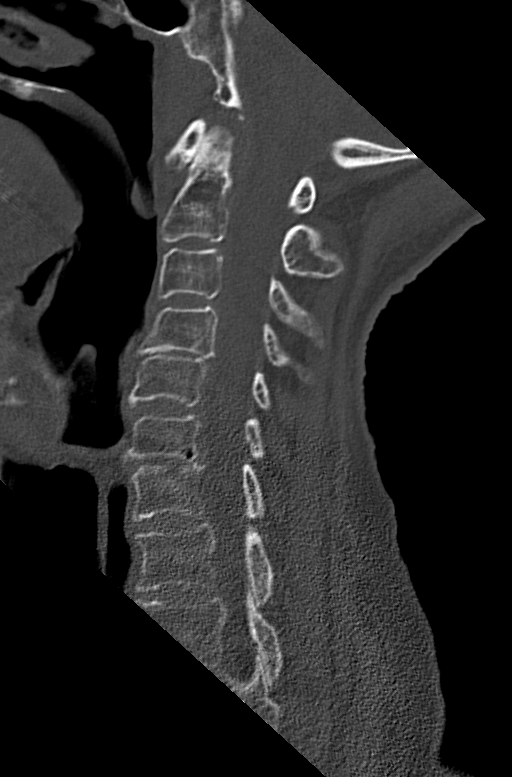
[im 36/61  bone]
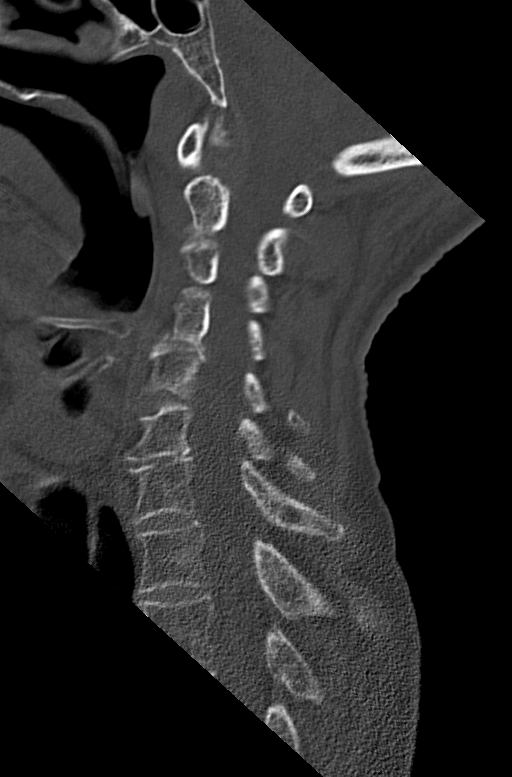
[im 41/61  bone]
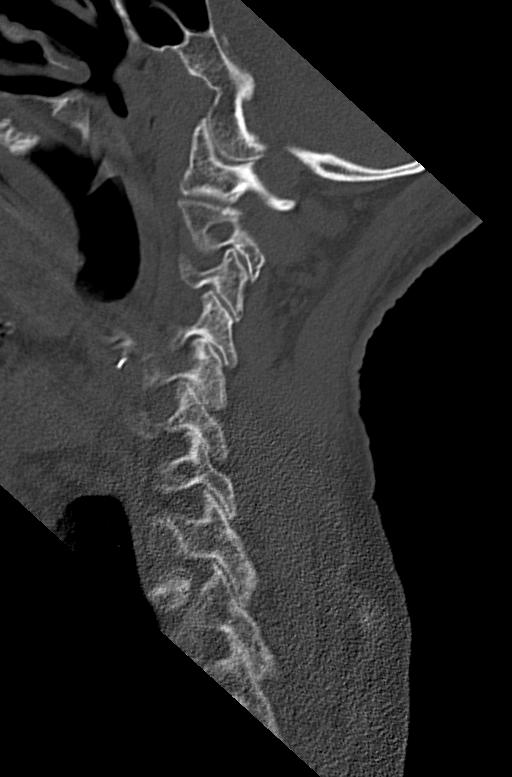

[13 of 27 positions shown; findings below may reference images not displayed]

FINDINGS: Alignment: The alignment of the cervical spine appears normal.

Skull base and vertebrae: The vertebral body heights are well
preserved. No acute fracture or dislocation.

Soft tissues and spinal canal: No prevertebral fluid or swelling. No
visible canal hematoma.

Disc levels: Multilevel disc space narrowing and endplate spurring
is most advanced at C4-5. The facet joints are all well aligned.
Mild left-sided facet arthropathy noted at C5-6 and C6-7.

Upper chest: Negative.

Other: None.
IMPRESSION: 1. No evidence for cervical spine fracture.
2. Cervical degenerative disc disease.
